# Patient Record
Sex: Female | Born: 2000
Health system: Southern US, Community
[De-identification: ages and names within clinical notes are randomized; demographics above are authoritative.]

## PROBLEM LIST (undated history)

## (undated) DIAGNOSIS — O039 Complete or unspecified spontaneous abortion without complication: Secondary | ICD-10-CM

## (undated) DIAGNOSIS — J45909 Unspecified asthma, uncomplicated: Secondary | ICD-10-CM

## (undated) HISTORY — PX: FRACTURE SURGERY: SHX138

## (undated) HISTORY — PX: OTHER SURGICAL HISTORY: SHX169

## (undated) HISTORY — PX: FINGER SURGERY: SHX640

## (undated) HISTORY — DX: Complete or unspecified spontaneous abortion without complication: O03.9

---
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
## 2019-11-05 ENCOUNTER — Inpatient Hospital Stay (HOSPITAL_COMMUNITY): Payer: Self-pay

## 2019-11-05 ENCOUNTER — Encounter (HOSPITAL_COMMUNITY): Payer: Self-pay | Admitting: Obstetrics and Gynecology

## 2019-11-05 ENCOUNTER — Inpatient Hospital Stay (HOSPITAL_COMMUNITY)
Admission: EM | Admit: 2019-11-05 | Discharge: 2019-11-05 | Disposition: A | Discharge status: Home / Self Care | Payer: Self-pay | Attending: Obstetrics and Gynecology | Admitting: Obstetrics and Gynecology

## 2019-11-05 ENCOUNTER — Other Ambulatory Visit: Payer: Self-pay

## 2019-11-05 DIAGNOSIS — O208 Other hemorrhage in early pregnancy: Secondary | ICD-10-CM | POA: Insufficient documentation

## 2019-11-05 DIAGNOSIS — J45909 Unspecified asthma, uncomplicated: Secondary | ICD-10-CM | POA: Insufficient documentation

## 2019-11-05 DIAGNOSIS — O3680X0 Pregnancy with inconclusive fetal viability, not applicable or unspecified: Secondary | ICD-10-CM

## 2019-11-05 DIAGNOSIS — Z6790 Unspecified blood type, Rh positive: Secondary | ICD-10-CM | POA: Insufficient documentation

## 2019-11-05 DIAGNOSIS — O4690 Antepartum hemorrhage, unspecified, unspecified trimester: Secondary | ICD-10-CM

## 2019-11-05 DIAGNOSIS — Z3A01 Less than 8 weeks gestation of pregnancy: Secondary | ICD-10-CM | POA: Insufficient documentation

## 2019-11-05 HISTORY — DX: Unspecified asthma, uncomplicated: J45.909

## 2019-11-05 LAB — CBC
HCT: 42.8 % (ref 36.0–46.0)
Hemoglobin: 13.5 g/dL (ref 12.0–15.0)
MCH: 27.8 pg (ref 26.0–34.0)
MCHC: 31.5 g/dL (ref 30.0–36.0)
MCV: 88.2 fL (ref 80.0–100.0)
Platelets: 320 10*3/uL (ref 150–400)
RBC: 4.85 MIL/uL (ref 3.87–5.11)
RDW: 13.2 % (ref 11.5–15.5)
WBC: 7.6 10*3/uL (ref 4.0–10.5)
nRBC: 0 % (ref 0.0–0.2)

## 2019-11-05 LAB — POCT PREGNANCY, URINE: Preg Test, Ur: POSITIVE — AB

## 2019-11-05 LAB — ABO/RH: ABO/RH(D): O POS

## 2019-11-05 LAB — URINALYSIS, ROUTINE W REFLEX MICROSCOPIC
Bilirubin Urine: NEGATIVE
Glucose, UA: NEGATIVE mg/dL
Hgb urine dipstick: NEGATIVE
Ketones, ur: NEGATIVE mg/dL
Leukocytes,Ua: NEGATIVE
Nitrite: NEGATIVE
Protein, ur: NEGATIVE mg/dL
Specific Gravity, Urine: 1.018 (ref 1.005–1.030)
pH: 7 (ref 5.0–8.0)

## 2019-11-05 LAB — WET PREP, GENITAL
Sperm: NONE SEEN
Trich, Wet Prep: NONE SEEN
Yeast Wet Prep HPF POC: NONE SEEN

## 2019-11-05 LAB — HCG, QUANTITATIVE, PREGNANCY: hCG, Beta Chain, Quant, S: 255 m[IU]/mL — ABNORMAL HIGH (ref <5)

## 2019-11-05 NOTE — Discharge Instructions (Signed)

## 2019-11-05 NOTE — ED Triage Notes (Signed)
Emergency Medicine Provider OB Triage Evaluation Note  [name] [name] is a 19 y.o. female, No obstetric history on file., at Unknown gestation who presents to the emergency department with complaints of vaginal bleeding.  She has had 3+ pregnancy test.  G1, P0.  First day of LMP was August 19.  She has an appointment at [facility], however has not yet been seen or had any ultrasounds this pregnancy.  She states that yesterday she started having vaginal spotting that is now progressed to vaginal bleeding with bilateral pelvic cramping.  She denies any trauma.  Review of  Systems  Positive: Vaginal bleeding Negative: No fevers  Physical Exam  BP 119/79   Pulse 92   Temp 98.5 F (36.9 C) (Oral)   Resp 16   Ht 5\' 6"  (1.676 m)   Wt 66.2 kg   SpO2 98%   BMI 23.57 kg/m  General: Awake, no distress  HEENT: Atraumatic  Resp: Normal effort  Cardiac: Normal rate Abd: Nondistended, nontender  MSK: Moves all extremities without difficulty Neuro: Speech clear  Medical Decision Making  Pt evaluated for pregnancy concern and is stable for transfer to [facility]. Pt is in agreement with plan for transfer.  1:50 PM Discussed with [facility] APP, , who accepts patient in transfer.  Clinical Impression   1. Vaginal bleeding in pregnancy        Shawna Orleans, Cristina Gong 11/05/19 1351

## 2019-11-05 NOTE — [facility] Note (Signed)
Found out preg last wk, +HPT x3. Started having some spotting yesterday, through out the day it got heavier, is more like period bleeding now. Some cramping.

## 2019-11-05 NOTE — MAU Provider Note (Signed)
History     CSN: [id number]  Arrival date and time: 11/05/19 1305   First Provider Initiated Contact with Patient 11/05/19 1722      Chief Complaint  Patient presents with  . Vaginal Bleeding  . Abdominal Pain   19 y.o. G1 @[redacted]w[redacted]d  by LMP presenting with VB. Reports spotting started yesterday. Today the bleeding became heavier and has been using a tampon every hour. Having some mild uterine cramping. Rates pain 5/10.    OB History    Gravida  1   Para      Term      Preterm      AB      Living        SAB      TAB      Ectopic      Multiple      Live Births              Past Medical History:  Diagnosis Date  . Asthma     Past Surgical History:  Procedure Laterality Date  . FINGER SURGERY    . FRACTURE SURGERY Left    4th digit  . tubes in ears      History reviewed. No pertinent family history.  Social History   Tobacco Use  . Smoking status: Never Smoker  . Smokeless tobacco: Never Used  Vaping Use  . Vaping Use: Former  Substance Use Topics  . Alcohol use: No  . Drug use: No    Allergies: No Known Allergies  No medications prior to admission.    Review of Systems  Gastrointestinal: Positive for abdominal pain.  Genitourinary: Positive for vaginal bleeding.   Physical Exam   Blood pressure (!) 123/56, pulse 79, temperature 98.3 F (36.8 C), temperature source Oral, resp. rate 18, height 5\' 6"  (1.676 m), weight 71.6 kg, last menstrual period 09/23/2019, SpO2 98 %.  Physical Exam Vitals and nursing note reviewed. Exam conducted with a chaperone present.  Constitutional:      General: She is not in acute distress.    Appearance: She is well-developed.  HENT:     Head: Normocephalic and atraumatic.  Cardiovascular:     Rate and Rhythm: Normal rate.  Pulmonary:     Effort: Pulmonary effort is normal. No respiratory distress.  Abdominal:     General: There is no distension.     Palpations: Abdomen is soft. There is no mass.      Tenderness: There is abdominal tenderness. There is no guarding or rebound.  Genitourinary:    Comments: External: no lesions or erythema Vagina: rugated, pink, moist, scant bloody discharge, cleared with 1 fox swab Uterus: non enlarged, anteverted, non tender, no CMT Adnexae: no masses, no tenderness left, no tenderness right Cervix closed  Musculoskeletal:        General: Normal range of motion.  Skin:    General: Skin is warm and dry.  Neurological:     General: No focal deficit present.     Mental Status: She is alert and oriented to person, place, and time.  Psychiatric:        Mood and Affect: Mood normal.        Behavior: Behavior normal.    Results for orders placed or performed during the hospital encounter of 11/05/19 (from the past 24 hour(s))  Pregnancy, urine POC     Status: Abnormal   Collection Time: 11/05/19  3:17 PM  Result Value Ref Range   Preg Test, Ur  POSITIVE (A) NEGATIVE  Urinalysis, Routine w reflex microscopic Urine, Clean Catch     Status: Abnormal   Collection Time: 11/05/19  3:30 PM  Result Value Ref Range   Color, Urine AMBER (A) YELLOW   APPearance CLOUDY (A) CLEAR   Specific Gravity, Urine 1.018 1.005 - 1.030   pH 7.0 5.0 - 8.0   Glucose, UA NEGATIVE NEGATIVE mg/dL   Hgb urine dipstick NEGATIVE NEGATIVE   Bilirubin Urine NEGATIVE NEGATIVE   Ketones, ur NEGATIVE NEGATIVE mg/dL   Protein, ur NEGATIVE NEGATIVE mg/dL   Nitrite NEGATIVE NEGATIVE   Leukocytes,Ua NEGATIVE NEGATIVE  CBC     Status: None   Collection Time: 11/05/19  4:26 PM  Result Value Ref Range   WBC 7.6 4.0 - 10.5 K/uL   RBC 4.85 3.87 - 5.11 MIL/uL   Hemoglobin 13.5 12.0 - 15.0 g/dL   HCT 51.7 36 - 46 %   MCV 88.2 80.0 - 100.0 fL   MCH 27.8 26.0 - 34.0 pg   MCHC 31.5 30.0 - 36.0 g/dL   RDW 61.6 07.3 - 71.0 %   Platelets 320 150 - 400 K/uL   nRBC 0.0 0.0 - 0.2 %  hCG, quantitative, pregnancy     Status: Abnormal   Collection Time: 11/05/19  4:26 PM  Result Value Ref  Range   hCG, Beta Chain, Quant, S 255 (H) <5 mIU/mL  ABO/Rh     Status: None   Collection Time: 11/05/19  4:26 PM  Result Value Ref Range   ABO/RH(D) O POS    No rh immune globuloin      NOT A RH IMMUNE GLOBULIN CANDIDATE, PT RH POSITIVE Performed at [facility], [address] [place], [place] [address]   Wet prep, genital     Status: Abnormal   Collection Time: 11/05/19  5:33 PM   Specimen: Cervix  Result Value Ref Range   Yeast Wet Prep HPF POC NONE SEEN NONE SEEN   Trich, Wet Prep NONE SEEN NONE SEEN   Clue Cells Wet Prep HPF POC PRESENT (A) NONE SEEN   WBC, Wet Prep HPF POC MODERATE (A) NONE SEEN   Sperm NONE SEEN    US OB LESS THAN 14 WEEKS WITH OB TRANSVAGINAL  Result Date: 11/05/2019 CLINICAL DATA:  Vaginal bleeding in first trimester of pregnancy, LMP 09/23/2019, quantitative beta HCG 255 EXAM: OBSTETRIC <14 WK [place] AND TRANSVAGINAL OB US TECHNIQUE: Both transabdominal and transvaginal ultrasound examinations were performed for complete evaluation of the gestation as well as the maternal uterus, adnexal regions, and pelvic cul-de-sac. Transvaginal technique was performed to assess early pregnancy. COMPARISON:  None FINDINGS: Intrauterine gestational sac: Absent Yolk sac:  N/A Embryo:  N/A Cardiac Activity: N/A Heart Rate: N/A  bpm MSD:   mm    w     d CRL:    mm    w    d                  [place] EDC: Subchorionic hemorrhage:  Insert LEFT Maternal uterus/adnexae: Uterus retroverted, normal in appearance. Small amount of endocervical canal fluid is seen. No focal uterine mass, endometrial fluid, or gestational sac identified. Endometrial complex measures 5 mm thick. RIGHT ovary normal size and morphology, 3.5 x 2.1 x 3.5 cm. LEFT ovary normal size and morphology, 1.4 x 3.4 x 1.6 cm. No free pelvic fluid or adnexal masses. IMPRESSION: No intrauterine gestation identified. Findings are consistent with pregnancy of unknown location. Differential  diagnosis includes early intrauterine  pregnancy too early to visualize, spontaneous abortion, and ectopic pregnancy. Serial quantitative beta HCG and or follow-up ultrasound recommended to definitively exclude ectopic pregnancy. Electronically Signed   By: Ulyses Southward M.D.   On: 11/05/2019 18:33   MAU Course  Procedures  MDM Labs ordered and reviewed. No IUGS, YS or FP seen on [place], findings could indicate early pregnancy, ectopic pregnancy, or failed pregnancy, discussed with pt. Will follow quant in 48 hrs. Stable for discharge home.   Assessment and Plan   1. Vaginal bleeding in pregnancy   2. Pregnancy, location unknown   3. Blood type, Rh positive    Discharge home Follow up at MAU on 11/07/19 for qhcg Ectopic/SAB precautions Tylenol prn Heating pad prn Pelvic rest  Allergies as of 11/05/2019   No Known Allergies     Medication List    STOP taking these medications   fexofenadine-pseudoephedrine 180-240 MG 24 hr tablet Commonly known as: ALLEGRA-D 24   Sprintec 28 0.25-35 MG-MCG tablet Generic drug: norgestimate-ethinyl estradiol      Donette Larry, CNM 11/05/2019, 7:21 PM

## 2019-11-05 NOTE — ED Triage Notes (Signed)
Pt had + pregnancy test 1 week ago, has had progressively worse bleeding since yesterday. Pt endorses 4/10 abdominal cramping x 1 day.

## 2019-11-07 ENCOUNTER — Other Ambulatory Visit: Payer: Self-pay

## 2019-11-07 ENCOUNTER — Inpatient Hospital Stay (HOSPITAL_COMMUNITY)
Admission: AD | Admit: 2019-11-07 | Discharge: 2019-11-07 | Disposition: A | Discharge status: Home / Self Care | Payer: Self-pay | Attending: Obstetrics & Gynecology | Admitting: Obstetrics & Gynecology

## 2019-11-07 DIAGNOSIS — O039 Complete or unspecified spontaneous abortion without complication: Secondary | ICD-10-CM | POA: Insufficient documentation

## 2019-11-07 LAB — HCG, QUANTITATIVE, PREGNANCY: hCG, Beta Chain, Quant, S: 100 m[IU]/mL — ABNORMAL HIGH (ref <5)

## 2019-11-07 NOTE — MAU Provider Note (Signed)
S [name] [name] is a 19 y.o. G1P0 at [redacted]w[redacted]d female who presents to MAU today for repeat bHCG. She has continued to have light-moderate bleeding and mild cramping.   O BP 104/72 (BP Location: Right Arm)   Pulse 65   Temp 98.4 F (36.9 C) (Oral)   Resp 17   LMP 09/23/2019   SpO2 98% Comment: ra  Results for orders placed or performed during the hospital encounter of 11/07/19 (from the past 24 hour(s))  hCG, quantitative, pregnancy     Status: Abnormal   Collection Time: 11/07/19  4:36 PM  Result Value Ref Range   hCG, Beta Chain, Quant, S 100 (H) <5 mIU/mL   A Spontaneous miscarriage Medical screening exam complete  P Discharge from MAU in stable condition with bleeding precautions Patient to establish care at [facility] for SAB follow up and gyn care Discussed reasons for early miscarriage and gave reassurance to patient about future fertility  Edd Arbour, CNM, MSN, [facility] 11/07/19 6:49 PM

## 2019-11-07 NOTE — MAU Note (Signed)
[name] [name] is a 19 y.o. at [redacted]w[redacted]d here in MAU reporting:  For follow up blood work. Denies pain +vaginal bleeding still but reports it is less than what is was before.  Vitals:   11/07/19 1641  BP: 104/72  Pulse: 65  Resp: 17  Temp: 98.4 F (36.9 C)  SpO2: 98%     Patient in family room.

## 2019-11-07 NOTE — Discharge Instructions (Signed)
Miscarriage A miscarriage is the loss of an unborn baby (fetus) before the 20th week of pregnancy. Follow these instructions at home: Medicines   Take over-the-counter and prescription medicines only as told by your doctor.  If you were prescribed antibiotic medicine, take it as told by your doctor. Do not stop taking the antibiotic even if you start to feel better.  Do not take NSAIDs unless your doctor says that this is safe for you. NSAIDs include aspirin and ibuprofen. These medicines can cause bleeding. Activity  Rest as directed. Ask your doctor what activities are safe for you.  Have someone help you at home during this time. General instructions  Write down how many pads you use each day and how soaked they are.  Watch the amount of tissue or clumps of blood (blood clots) that you pass from your vagina. Save any large amounts of tissue for your doctor.  Do not use tampons, douche, or have sex until your doctor approves.  To help you and your partner with the process of grieving, talk with your doctor or seek counseling.  When you are ready, meet with your doctor to talk about steps you should take for your health. Also, talk with your doctor about steps to take to have a healthy pregnancy in the future.  Keep all follow-up visits as told by your doctor. This is important. Contact a doctor if:  You have a fever or chills.  You have vaginal discharge that smells bad.  You have more bleeding. Get help right away if:  You have very bad cramps or pain in your back or belly.  You pass clumps of blood that are walnut-sized or larger from your vagina.  You pass tissue that is walnut-sized or larger from your vagina.  You soak more than 1 regular pad in an hour.  You get light-headed or weak.  You faint (pass out).  You have feelings of sadness that do not go away, or you have thoughts of hurting yourself. Summary  A miscarriage is the loss of an unborn baby before  the 20th week of pregnancy.  Follow your doctor's instructions for home care. Keep all follow-up appointments.  To help you and your partner with the process of grieving, talk with your doctor or seek counseling. This information is not intended to replace advice given to you by your health care provider. Make sure you discuss any questions you have with your health care provider. Document Revised: 05/15/2018 Document Reviewed: 02/27/2016 Elsevier Patient Education  2020 Elsevier Inc.  

## 2019-11-08 LAB — GC/CHLAMYDIA PROBE AMP (~~LOC~~) NOT AT ARMC
Chlamydia: NEGATIVE
Comment: NEGATIVE
Comment: NORMAL
Neisseria Gonorrhea: NEGATIVE

## 2019-11-15 ENCOUNTER — Other Ambulatory Visit: Payer: Self-pay

## 2019-11-15 DIAGNOSIS — O039 Complete or unspecified spontaneous abortion without complication: Secondary | ICD-10-CM

## 2019-11-17 ENCOUNTER — Telehealth: Payer: Self-pay | Admitting: *Deleted

## 2019-11-17 NOTE — Telephone Encounter (Signed)
-----   Message from Kimberly R Booker, CNM sent at 11/17/2019 10:47 AM EDT ----- Please call pt and have her come in for a repeat BHCG to make sure levels are continuing to drop. Thanks 

## 2019-11-17 NOTE — Telephone Encounter (Signed)
Left message @ 12:26 pm. JSY

## 2019-11-18 NOTE — Telephone Encounter (Signed)
Left message @ 12:37 pm. JSY

## 2019-11-18 NOTE — Telephone Encounter (Signed)
-----   Message from Cheral Marker, [place] sent at 11/17/2019 10:47 AM EDT ----- Please call pt and have her come in for a repeat BHCG to make sure levels are continuing to drop. Thanks

## 2019-11-22 ENCOUNTER — Telehealth: Payer: Self-pay | Admitting: *Deleted

## 2019-11-22 ENCOUNTER — Ambulatory Visit: Payer: Self-pay | Admitting: Advanced Practice Midwife

## 2019-11-22 NOTE — Telephone Encounter (Signed)
Left message @ 4:33 pm. JSY 

## 2019-11-22 NOTE — Telephone Encounter (Signed)
Left message @ 4:33 pm. JSY

## 2019-11-29 NOTE — Telephone Encounter (Signed)
Multiple attempts to reach pt and pt was unreachable. Encounter closed. JSY

## 2020-10-23 ENCOUNTER — Encounter (HOSPITAL_COMMUNITY): Payer: Self-pay | Admitting: Obstetrics and Gynecology

## 2020-10-23 ENCOUNTER — Other Ambulatory Visit: Payer: Self-pay

## 2020-10-23 ENCOUNTER — Ambulatory Visit: Payer: Self-pay

## 2020-10-23 ENCOUNTER — Inpatient Hospital Stay (HOSPITAL_COMMUNITY)
Admission: AD | Admit: 2020-10-23 | Discharge: 2020-10-23 | Disposition: A | Discharge status: Home / Self Care | Payer: BC Managed Care – PPO | Attending: Obstetrics and Gynecology | Admitting: Obstetrics and Gynecology

## 2020-10-23 ENCOUNTER — Inpatient Hospital Stay (HOSPITAL_COMMUNITY): Payer: BC Managed Care – PPO

## 2020-10-23 DIAGNOSIS — Z6790 Unspecified blood type, Rh positive: Secondary | ICD-10-CM

## 2020-10-23 DIAGNOSIS — Z3A10 10 weeks gestation of pregnancy: Secondary | ICD-10-CM | POA: Diagnosis not present

## 2020-10-23 DIAGNOSIS — O4690 Antepartum hemorrhage, unspecified, unspecified trimester: Secondary | ICD-10-CM

## 2020-10-23 DIAGNOSIS — O4691 Antepartum hemorrhage, unspecified, first trimester: Secondary | ICD-10-CM | POA: Diagnosis not present

## 2020-10-23 DIAGNOSIS — Z3A00 Weeks of gestation of pregnancy not specified: Secondary | ICD-10-CM | POA: Diagnosis not present

## 2020-10-23 DIAGNOSIS — O3680X0 Pregnancy with inconclusive fetal viability, not applicable or unspecified: Secondary | ICD-10-CM | POA: Diagnosis not present

## 2020-10-23 DIAGNOSIS — O209 Hemorrhage in early pregnancy, unspecified: Secondary | ICD-10-CM | POA: Insufficient documentation

## 2020-10-23 DIAGNOSIS — Z87891 Personal history of nicotine dependence: Secondary | ICD-10-CM | POA: Diagnosis not present

## 2020-10-23 LAB — COMPREHENSIVE METABOLIC PANEL
ALT: 36 U/L (ref 0–44)
AST: 30 U/L (ref 15–41)
Albumin: 3.5 g/dL (ref 3.5–5.0)
Alkaline Phosphatase: 61 U/L (ref 38–126)
Anion gap: 7 (ref 5–15)
BUN: 10 mg/dL (ref 6–20)
CO2: 25 mmol/L (ref 22–32)
Calcium: 9.1 mg/dL (ref 8.9–10.3)
Chloride: 105 mmol/L (ref 98–111)
Creatinine, Ser: 0.72 mg/dL (ref 0.44–1.00)
GFR, Estimated: >60 mL/min (ref >60)
Glucose, Bld: 111 mg/dL — ABNORMAL HIGH (ref 70–99)
Potassium: 3.7 mmol/L (ref 3.5–5.1)
Sodium: 137 mmol/L (ref 135–145)
Total Bilirubin: 1 mg/dL (ref 0.3–1.2)
Total Protein: 6.5 g/dL (ref 6.5–8.1)

## 2020-10-23 LAB — CBC WITH DIFFERENTIAL/PLATELET
Abs Immature Granulocytes: 0.01 10*3/uL (ref 0.00–0.07)
Basophils Absolute: 0.1 10*3/uL (ref 0.0–0.1)
Basophils Relative: 1 %
Eosinophils Absolute: 0.2 10*3/uL (ref 0.0–0.5)
Eosinophils Relative: 2 %
HCT: 38.1 % (ref 36.0–46.0)
Hemoglobin: 12.4 g/dL (ref 12.0–15.0)
Immature Granulocytes: 0 %
Lymphocytes Relative: 23 %
Lymphs Abs: 2.1 10*3/uL (ref 0.7–4.0)
MCH: 28 pg (ref 26.0–34.0)
MCHC: 32.5 g/dL (ref 30.0–36.0)
MCV: 86 fL (ref 80.0–100.0)
Monocytes Absolute: 0.9 10*3/uL (ref 0.1–1.0)
Monocytes Relative: 10 %
Neutro Abs: 6 10*3/uL (ref 1.7–7.7)
Neutrophils Relative %: 64 %
Platelets: 318 10*3/uL (ref 150–400)
RBC: 4.43 MIL/uL (ref 3.87–5.11)
RDW: 12.9 % (ref 11.5–15.5)
WBC: 9.2 10*3/uL (ref 4.0–10.5)
nRBC: 0 % (ref 0.0–0.2)

## 2020-10-23 LAB — WET PREP, GENITAL
Sperm: NONE SEEN
Trich, Wet Prep: NONE SEEN
Yeast Wet Prep HPF POC: NONE SEEN

## 2020-10-23 LAB — HCG, QUANTITATIVE, PREGNANCY: hCG, Beta Chain, Quant, S: 749 m[IU]/mL — ABNORMAL HIGH (ref <5)

## 2020-10-23 LAB — POCT PREGNANCY, URINE: Preg Test, Ur: POSITIVE — AB

## 2020-10-23 NOTE — MAU Note (Signed)
Unable to void. Pt to lobby

## 2020-10-23 NOTE — MAU Note (Signed)
+  HPT about 1.5 wks ago.  Started bleeding around 1130, started as light pink, spotting. About an hour ago, saw that it was a lot darker and there was some small clots in it. Some cramping.

## 2020-10-23 NOTE — MAU Provider Note (Signed)
History     CSN: [id number]  Arrival date and time: 10/23/20 1610   Event Date/Time   First Provider Initiated Contact with Patient 10/23/20 1739      No chief complaint on file.  Ms. [name] [name] is a 20 y.o. G2P0 at [redacted]w[redacted]d who presents to MAU for vaginal bleeding which began about 11am-12pm this morning. Patient states initially it was just pink spotting, but then it turned in to darker red bleeding with small clots. Patient shows provider images of her bleeding, which shows a small amount of dark red blood without clotting. Patient stats she had cramping earlier today, but it is not present at this time.  Pt denies vaginal discharge/odor/itching. Pt denies N/V, abdominal pain, constipation, diarrhea, or urinary problems. Pt denies fever, chills, fatigue, sweating or changes in appetite. Pt denies SOB or chest pain. Pt denies dizziness, HA, light-headedness, weakness.   OB History     Gravida  2   Para      Term      Preterm      AB      Living         SAB      IAB      Ectopic      Multiple      Live Births              Past Medical History:  Diagnosis Date   Asthma     Past Surgical History:  Procedure Laterality Date   FINGER SURGERY     FRACTURE SURGERY Left    4th digit   tubes in ears      No family history on file.  Social History   Tobacco Use   Smoking status: Never   Smokeless tobacco: Never  Vaping Use   Vaping Use: Former  Substance Use Topics   Alcohol use: No   Drug use: No    Allergies: No Known Allergies  No medications prior to admission.    Review of Systems  Constitutional:  Negative for chills, diaphoresis, fatigue and fever.  Eyes:  Negative for visual disturbance.  Respiratory:  Negative for shortness of breath.   Cardiovascular:  Negative for chest pain.  Gastrointestinal:  Negative for abdominal pain, constipation, diarrhea, nausea and vomiting.  Genitourinary:  Positive for vaginal bleeding.  Negative for dysuria, flank pain, frequency, pelvic pain, urgency and vaginal discharge.  Neurological:  Negative for dizziness, weakness, light-headedness and headaches.   Physical Exam   Blood pressure 118/73, pulse 74, temperature 98.7 F (37.1 C), temperature source Oral, resp. rate 18, height 5\' 6"  (1.676 m), weight 92 kg, last menstrual period 08/11/2020, SpO2 96 %, unknown if currently breastfeeding.  Patient Vitals for the past 24 hrs:  BP Temp Temp src Pulse Resp SpO2 Height Weight  10/23/20 1731 -- -- -- -- -- 96 % -- --  10/23/20 1730 118/73 -- -- 74 -- -- -- --  10/23/20 1624 115/68 98.7 F (37.1 C) Oral 75 18 99 % 5\' 6"  (1.676 m) 92 kg   Physical Exam Vitals and nursing note reviewed.  Constitutional:      General: She is not in acute distress.    Appearance: Normal appearance. She is not ill-appearing, toxic-appearing or diaphoretic.  HENT:     Head: Normocephalic and atraumatic.  Pulmonary:     Effort: Pulmonary effort is normal.  Neurological:     Mental Status: She is alert and oriented to person, place, and time.  Psychiatric:  Mood and Affect: Mood normal.        Behavior: Behavior normal.        Thought Content: Thought content normal.        Judgment: Judgment normal.   Results for orders placed or performed during the hospital encounter of 10/23/20 (from the past 24 hour(s))  Pregnancy, urine POC     Status: Abnormal   Collection Time: 10/23/20  5:21 PM  Result Value Ref Range   Preg Test, Ur POSITIVE (A) NEGATIVE  Wet prep, genital     Status: Abnormal   Collection Time: 10/23/20  5:56 PM  Result Value Ref Range   Yeast Wet Prep HPF POC NONE SEEN NONE SEEN   Trich, Wet Prep NONE SEEN NONE SEEN   Clue Cells Wet Prep HPF POC PRESENT (A) NONE SEEN   WBC, Wet Prep HPF POC MANY (A) NONE SEEN   Sperm NONE SEEN   CBC with Differential/Platelet     Status: None   Collection Time: 10/23/20  6:00 PM  Result Value Ref Range   WBC 9.2 4.0 - 10.5 K/uL    RBC 4.43 3.87 - 5.11 MIL/uL   Hemoglobin 12.4 12.0 - 15.0 g/dL   HCT 28.7 68.1 - 15.7 %   MCV 86.0 80.0 - 100.0 fL   MCH 28.0 26.0 - 34.0 pg   MCHC 32.5 30.0 - 36.0 g/dL   RDW 26.2 03.5 - 59.7 %   Platelets 318 150 - 400 K/uL   nRBC 0.0 0.0 - 0.2 %   Neutrophils Relative % 64 %   Neutro Abs 6.0 1.7 - 7.7 K/uL   Lymphocytes Relative 23 %   Lymphs Abs 2.1 0.7 - 4.0 K/uL   Monocytes Relative 10 %   Monocytes Absolute 0.9 0.1 - 1.0 K/uL   Eosinophils Relative 2 %   Eosinophils Absolute 0.2 0.0 - 0.5 K/uL   Basophils Relative 1 %   Basophils Absolute 0.1 0.0 - 0.1 K/uL   Immature Granulocytes 0 %   Abs Immature Granulocytes 0.01 0.00 - 0.07 K/uL  Comprehensive metabolic panel     Status: Abnormal   Collection Time: 10/23/20  6:00 PM  Result Value Ref Range   Sodium 137 135 - 145 mmol/L   Potassium 3.7 3.5 - 5.1 mmol/L   Chloride 105 98 - 111 mmol/L   CO2 25 22 - 32 mmol/L   Glucose, Bld 111 (H) 70 - 99 mg/dL   BUN 10 6 - 20 mg/dL   Creatinine, Ser 4.16 0.44 - 1.00 mg/dL   Calcium 9.1 8.9 - 38.4 mg/dL   Total Protein 6.5 6.5 - 8.1 g/dL   Albumin 3.5 3.5 - 5.0 g/dL   AST 30 15 - 41 U/L   ALT 36 0 - 44 U/L   Alkaline Phosphatase 61 38 - 126 U/L   Total Bilirubin 1.0 0.3 - 1.2 mg/dL   GFR, Estimated >53 >64 mL/min   Anion gap 7 5 - 15  hCG, quantitative, pregnancy     Status: Abnormal   Collection Time: 10/23/20  6:00 PM  Result Value Ref Range   hCG, Beta Chain, Quant, S 749 (H) <5 mIU/mL   US OB LESS THAN 14 WEEKS WITH OB TRANSVAGINAL  Result Date: 10/23/2020 CLINICAL DATA:  20 year old pregnant female with vaginal bleeding. EXAM: OBSTETRIC <14 WK [place] AND TRANSVAGINAL OB US TECHNIQUE: Both transabdominal and transvaginal ultrasound examinations were performed for complete evaluation of the gestation as well as the maternal uterus, adnexal  regions, and pelvic cul-de-sac. Transvaginal technique was performed to assess early pregnancy. COMPARISON:  OB ultrasound 11/05/2019.  FINDINGS: Intrauterine gestational sac: None. Yolk sac:  None. Embryo:  None. Cardiac Activity: None. Heart Rate: N/A Subchorionic hemorrhage:  None visualized. Maternal uterus/adnexae: Mobile echogenic material in the endometrial canal likely represents blood products. Bilateral ovaries are normal in appearance. IMPRESSION: 1. No viable IUP identified. Findings on today's examination likely reflect a spontaneous abortion in progress. Electronically Signed   By: Trudie Reed M.D.   On: 10/23/2020 19:36    MAU Course  Procedures  MDM -r/o ectopic -UA: pending at time of discharge -CBC: WNL -CMP: WNL -[place]: PUL -hCG: 749 -ABO: O Positive -WetPrep: +ClueCells (isolated finding not requiring treatment) -GC/CT collected Discussed with client the diagnosis of pregnancy of unknown anatomic location.  Three possibilities of outcome are: a healthy pregnancy that is too early to see a yolk sac to confirm the pregnancy is in the uterus, a pregnancy that is not healthy and has not developed and will not develop, and an ectopic pregnancy that is in the abdomen that cannot be identified at this time.  And ectopic pregnancy can be a life threatening situation as a pregnancy needs to be in the uterus which is a muscle and can stretch to accommodate the growth of a pregnancy.  Other structures in the pelvis and abdomen as not muscular and do not stretch with the growth of a pregnancy.  Worst case scenario is that a structure ruptures with a growing pregnancy not in the uterus and and internal hemorrhage can be a life threatening situation.  We need to follow the progression of this pregnancy carefully.  We need to check another serum pregnancy hormone level to determine if the levels are rising appropriately  and to determine the next steps that are needed for you. Patient's questions were answered. -pt states is not currently established with an OB in [place], but does have appt with Nestor Ramp on Friday of  this week -called and spoke with Dr. Timothy Lasso who states that [facility] will not see this patient for f/u hCG as she is not an established patient -pt discharged to home in stable condition  Orders Placed This Encounter  Procedures   Wet prep, genital    Standing Status:   Standing    Number of Occurrences:   1   US OB LESS THAN 14 WEEKS WITH OB TRANSVAGINAL    Standing Status:   Standing    Number of Occurrences:   1    Order Specific Question:   Symptom/Reason for Exam    Answer:   Vaginal bleeding in pregnancy [[id number]]   Urinalysis, Routine w reflex microscopic Urine, Clean Catch    Standing Status:   Standing    Number of Occurrences:   1   CBC with Differential/Platelet    Standing Status:   Standing    Number of Occurrences:   1   Comprehensive metabolic panel    Standing Status:   Standing    Number of Occurrences:   1   hCG, quantitative, pregnancy    Standing Status:   Standing    Number of Occurrences:   1   Pregnancy, urine POC    Standing Status:   Standing    Number of Occurrences:   1   Discharge patient    Order Specific Question:   Discharge disposition    Answer:   01-Home or Self Care [1]  Order Specific Question:   Discharge patient date    Answer:   10/23/2020   No orders of the defined types were placed in this encounter.  Assessment and Plan   1. Pregnancy of unknown anatomic location   2. Vaginal bleeding in pregnancy   3. Blood type, Rh positive     Allergies as of 10/23/2020   No Known Allergies      Medication List    You have not been prescribed any medications.    -will call with culture results, if positive -safe meds in pregnancy list given -discussed ectopic vs. First trimester vs. miscarriage -strict ectopic precautions given -return MAU precautions -f/u on 10/26/2020 at [facility] for repeat hCG, appt scheduled -pt discharged to home in stable condition  Joni Reining E Binh Doten 10/23/2020, 8:20 PM

## 2020-10-23 NOTE — Telephone Encounter (Signed)
Pt. Reports she is about [redacted] weeks pregnant and this morning saw pink discharge on tissue paper. No pain or cramping. Waiting for call back for appointment from OB/GYN. Instructed to call OB office and let them know. Verbalizes understanding.    Reason for Disposition  MILD vaginal bleeding (i.e., less than 1 pad / hour; less than patient's usual menstrual bleeding; not just spotting)  Answer Assessment - Initial Assessment Questions 1. ONSET: "When did this bleeding start?"       Today 2. DESCRIPTION: "Describe the bleeding that you are having." "How much bleeding is there?"    - SPOTTING: spotting, or pinkish / brownish mucous discharge; does not fill panty liner or pad    - MILD:  less than 1 pad / hour; less than patient's usual menstrual bleeding   - MODERATE: 1-2 pads / hour; 1 menstrual cup every 6 hours; small-medium blood clots (e.g., pea, grape, small coin)   - SEVERE: soaking 2 or more pads/hour for 2 or more hours; 1 menstrual cup every 2 hours; bleeding not contained by pads or continuous red blood from vagina; large blood clots (e.g., golf ball, large coin)      Spotting 3. ABDOMINAL PAIN SEVERITY: If present, ask: "How bad is it?"  (e.g., Scale 1-10; mild, moderate, or severe)   - MILD (1-3): doesn't interfere with normal activities, abdomen soft and not tender to touch    - MODERATE (4-7): interferes with normal activities or awakens from sleep, abdomen tender to touch    - SEVERE (8-10): excruciating pain, doubled over, unable to do any normal activities     No 4. PREGNANCY: "Do you know how many weeks or months pregnant you are?" "When was the first day of your last normal menstrual period?"     8 weeks 5. HEMODYNAMIC STATUS: "Are you weak or feeling lightheaded?" If Yes, ask: "Can you stand and walk normally?"      No 6. OTHER SYMPTOMS: "What other symptoms are you having with the bleeding?" (e.g., passed tissue, vaginal discharge, fever, menstrual-type cramps)      No  Protocols used: Pregnancy - Vaginal Bleeding Less Than [redacted] Weeks EGA-A-AH

## 2020-10-23 NOTE — Discharge Instructions (Signed)

## 2020-10-24 LAB — GC/CHLAMYDIA PROBE AMP (~~LOC~~) NOT AT ARMC
Chlamydia: NEGATIVE
Comment: NEGATIVE
Comment: NORMAL
Neisseria Gonorrhea: NEGATIVE

## 2020-10-26 ENCOUNTER — Ambulatory Visit: Payer: BC Managed Care – PPO

## 2021-01-25 ENCOUNTER — Ambulatory Visit (INDEPENDENT_AMBULATORY_CARE_PROVIDER_SITE_OTHER): Payer: BC Managed Care – PPO | Admitting: Family

## 2021-01-25 ENCOUNTER — Encounter: Payer: Self-pay | Admitting: Family

## 2021-01-25 ENCOUNTER — Other Ambulatory Visit (HOSPITAL_COMMUNITY)
Admission: RE | Admit: 2021-01-25 | Discharge: 2021-01-25 | Disposition: A | Discharge status: Home / Self Care | Payer: BC Managed Care – PPO | Source: Ambulatory Visit | Attending: Family | Admitting: Family

## 2021-01-25 ENCOUNTER — Other Ambulatory Visit: Payer: Self-pay | Admitting: Family

## 2021-01-25 VITALS — BP 106/74 | HR 91 | Temp 97.5°F | Ht 66.0 in | Wt 195.2 lb

## 2021-01-25 DIAGNOSIS — R350 Frequency of micturition: Secondary | ICD-10-CM | POA: Diagnosis not present

## 2021-01-25 DIAGNOSIS — J069 Acute upper respiratory infection, unspecified: Secondary | ICD-10-CM | POA: Diagnosis not present

## 2021-01-25 DIAGNOSIS — Z113 Encounter for screening for infections with a predominantly sexual mode of transmission: Secondary | ICD-10-CM | POA: Diagnosis not present

## 2021-01-25 DIAGNOSIS — B350 Tinea barbae and tinea capitis: Secondary | ICD-10-CM | POA: Diagnosis not present

## 2021-01-25 LAB — URINALYSIS, COMPLETE
Bilirubin, UA: NEGATIVE
Glucose, UA: NEGATIVE
Ketones, UA: NEGATIVE
Nitrite, UA: POSITIVE — AB
Specific Gravity, UA: 1.02 (ref 1.005–1.030)
Urobilinogen, Ur: 0.2 mg/dL (ref 0.2–1.0)
pH, UA: 6.5 (ref 5.0–7.5)

## 2021-01-25 LAB — MICROSCOPIC EXAMINATION
RBC, Urine: NONE SEEN /hpf (ref 0–2)
WBC, UA: >30 /hpf — AB (ref 0–5)

## 2021-01-25 MED ORDER — CEPHALEXIN 500 MG PO CAPS: 500.0000 mg | ORAL_CAPSULE | Freq: Two times a day (BID) | ORAL | 0 refills | Status: DC

## 2021-01-25 MED ORDER — PREDNISONE 10 MG (21) PO TBPK: ORAL_TABLET | ORAL | 0 refills | Status: DC

## 2021-01-25 NOTE — Progress Notes (Signed)
Subjective:    Patient ID: [name] [name], female    DOB: [birth year], 20 y.o.   MRN: [id number]  Chief Complaint  Patient presents with   Exposure to STD    Has a new partner just wants checked.    Urinary Tract Infection   URI    Over a week    PT reports to the office today with UTI symptoms. She has had a new sex partner and wants to be checked for STI's while here.  Exposure to STD  The patient's pertinent negatives include no discharge, dysuria or pelvic pain. Associate symptoms include urinary frequency. Pertinent negatives include no sore throat.  URI  This is a new problem. The current episode started 1 to 4 weeks ago. The problem has been waxing and waning. Associated symptoms include congestion, coughing and rhinorrhea. Pertinent negatives include no dysuria, ear pain, headaches, nausea, sinus pain, sore throat or vomiting. She has tried decongestant for the symptoms. The treatment provided mild relief.  Urinary Frequency  This is a new problem. The current episode started in the past 7 days. The problem has been rapidly improving. The patient is experiencing no pain. Associated symptoms include frequency and hesitancy. Pertinent negatives include no discharge, flank pain, hematuria, nausea, urgency or vomiting. She has tried increased fluids for the symptoms. The treatment provided mild relief.     Review of Systems  HENT:  Positive for congestion and rhinorrhea. Negative for ear pain, sinus pain and sore throat.   Respiratory:  Positive for cough.   Gastrointestinal:  Negative for nausea and vomiting.  Genitourinary:  Positive for frequency and hesitancy. Negative for dysuria, flank pain, hematuria, pelvic pain and urgency.  Neurological:  Negative for headaches.  All other systems reviewed and are negative.     Objective:   Physical Exam Vitals reviewed.  Constitutional:      General: She is not in acute distress.    Appearance: She is well-developed.  HENT:      Head: Normocephalic and atraumatic.     Right Ear: Tympanic membrane normal.     Left Ear: Tympanic membrane normal.  Eyes:     Pupils: Pupils are equal, round, and reactive to light.  Neck:     Thyroid: No thyromegaly.  Cardiovascular:     Rate and Rhythm: Normal rate and regular rhythm.     Heart sounds: Normal heart sounds. No murmur heard. Pulmonary:     Effort: Pulmonary effort is normal. No respiratory distress.     Breath sounds: Normal breath sounds. No wheezing.     Comments: Intermittent cough Abdominal:     General: Bowel sounds are normal. There is no distension.     Palpations: Abdomen is soft.     Tenderness: There is no abdominal tenderness.  Musculoskeletal:        General: No tenderness. Normal range of motion.     Cervical back: Normal range of motion and neck supple.  Skin:    General: Skin is warm and dry.  Neurological:     Mental Status: She is alert and oriented to person, place, and time.     Cranial Nerves: No cranial nerve deficit.     Deep Tendon Reflexes: Reflexes are normal and symmetric.  Psychiatric:        Behavior: Behavior normal.        Thought Content: Thought content normal.        Judgment: Judgment normal.    BP 106/74  Pulse 91    Temp (!) 97.5 F (36.4 C) (Temporal)    Ht 5\' 6"  (1.676 m)    Wt 195 lb 3.2 oz (88.5 kg)    LMP 01/04/2021    Breastfeeding Unknown    BMI 31.51 kg/m      Assessment & Plan:  [name] [name] comes in today with chief complaint of Exposure to STD (Has a new partner just wants checked. ), Urinary Tract Infection, and URI (Over a week )   Diagnosis and orders addressed:  1. Viral URI with cough - Take meds as prescribed - Use a cool mist humidifier  -Use saline nose sprays frequently -Force fluids -For any cough or congestion  Use plain Mucinex- regular strength or max strength is fine -For fever or aces or pains- take tylenol or ibuprofen. -Throat lozenges if help -Smoking cessation discussed   Start prednisone  - predniSONE (STERAPRED UNI-PAK 21 TAB) 10 MG (21) TBPK tablet; Use as directed  Dispense: 21 tablet; Refill: 0  2. Urinary frequency - Urinalysis, Complete - Urine Culture  3. Routine screening for STI (sexually transmitted infection) Safe sex - Urine cytology ancillary only - HepB+HepC+HIV Panel - Herpes simplex virus(hsv) dna by pcr - RPR     [name] Dun, FNP

## 2021-01-25 NOTE — Progress Notes (Signed)
Pt aware of urine results and that keflex was sent to pharm

## 2021-01-25 NOTE — Addendum Note (Signed)
Addended by: Austin Miles F on: 01/25/2021 12:10 PM   Modules accepted: Orders

## 2021-01-25 NOTE — Patient Instructions (Signed)

## 2021-01-26 LAB — URINE CYTOLOGY ANCILLARY ONLY
Bacterial Vaginitis-Urine: NEGATIVE
Candida Urine: NEGATIVE
Chlamydia: NEGATIVE
Comment: NEGATIVE
Comment: NEGATIVE
Comment: NORMAL
Neisseria Gonorrhea: NEGATIVE
Trichomonas: NEGATIVE

## 2021-01-30 LAB — HEPB+HEPC+HIV PANEL
HIV Screen 4th Generation wRfx: NONREACTIVE
Hep B C IgM: NEGATIVE
Hep B Core Total Ab: NEGATIVE
Hep B E Ab: NEGATIVE
Hep B E Ag: NEGATIVE
Hep B Surface Ab, Qual: NONREACTIVE
Hep C Virus Ab: <0.1 s/co ratio (ref 0.0–0.9)
Hepatitis B Surface Ag: NEGATIVE

## 2021-01-30 LAB — URINE CULTURE

## 2021-01-30 LAB — HSV(HERPES SIMPLEX VRS) I + II AB-IGG
HSV 1 Glycoprotein G Ab, IgG: <0.91 index (ref 0.00–0.90)
HSV 2 IgG, Type Spec: <0.91 index (ref 0.00–0.90)

## 2021-01-30 LAB — RPR: RPR Ser Ql: NONREACTIVE

## 2021-03-13 ENCOUNTER — Encounter: Payer: Self-pay | Admitting: Family

## 2021-03-13 ENCOUNTER — Ambulatory Visit (INDEPENDENT_AMBULATORY_CARE_PROVIDER_SITE_OTHER): Payer: Medicaid Other | Admitting: Family

## 2021-03-13 VITALS — BP 121/71 | HR 103 | Temp 97.6°F | Ht 66.0 in | Wt 198.6 lb

## 2021-03-13 DIAGNOSIS — Z3A01 Less than 8 weeks gestation of pregnancy: Secondary | ICD-10-CM

## 2021-03-13 DIAGNOSIS — N926 Irregular menstruation, unspecified: Secondary | ICD-10-CM | POA: Diagnosis not present

## 2021-03-13 LAB — PREGNANCY, URINE: Preg Test, Ur: POSITIVE — AB

## 2021-03-13 NOTE — Patient Instructions (Signed)
First Trimester of Pregnancy °The first trimester of pregnancy starts on the first day of your last menstrual period until the end of week 12. This is months 1 through 3 of pregnancy. A week after a sperm fertilizes an egg, the egg will implant into the wall of the uterus and begin to develop into a baby. By the end of 12 weeks, all the baby's organs will be formed and the baby will be 2-3 inches in size. °Body changes during your first trimester °Your body goes through many changes during pregnancy. The changes vary and generally return to normal after your baby is born. °Physical changes °You may gain or lose weight. °Your breasts may begin to grow larger and become tender. The tissue that surrounds your nipples (areola) may become darker. °Dark spots or blotches (chloasma or mask of pregnancy) may develop on your face. °You may have changes in your hair. These can include thickening or thinning of your hair or changes in texture. °Health changes °You may feel nauseous, and you may vomit. °You may have heartburn. °You may develop headaches. °You may develop constipation. °Your gums may bleed and may be sensitive to brushing and flossing. °Other changes °You may tire easily. °You may urinate more often. °Your menstrual periods will stop. °You may have a loss of appetite. °You may develop cravings for certain kinds of food. °You may have changes in your emotions from day to day. °You may have more vivid and strange dreams. °Follow these instructions at home: °Medicines °Follow your health care provider's instructions regarding medicine use. Specific medicines may be either safe or unsafe to take during pregnancy. Do not take any medicines unless told to by your health care provider. °Take a prenatal vitamin that contains at least 600 micrograms (mcg) of folic acid. °Eating and drinking °Eat a healthy diet that includes fresh fruits and vegetables, whole grains, good sources of protein such as meat, eggs, or tofu,  and low-fat dairy products. °Avoid raw meat and unpasteurized juice, milk, and cheese. These carry germs that can harm you and your baby. °If you feel nauseous or you vomit: °Eat 4 or 5 small meals a day instead of 3 large meals. °Try eating a few soda crackers. °Drink liquids between meals instead of during meals. °You may need to take these actions to prevent or treat constipation: °Drink enough fluid to keep your urine pale yellow. °Eat foods that are high in fiber, such as beans, whole grains, and fresh fruits and vegetables. °Limit foods that are high in fat and processed sugars, such as fried or sweet foods. °Activity °Exercise only as directed by your health care provider. Most people can continue their usual exercise routine during pregnancy. Try to exercise for 30 minutes at least 5 days a week. °Stop exercising if you develop pain or cramping in the lower abdomen or lower back. °Avoid exercising if it is very hot or humid or if you are at high altitude. °Avoid heavy lifting. °If you choose to, you may have sex unless your health care provider tells you not to. °Relieving pain and discomfort °Wear a good support bra to relieve breast tenderness. °Rest with your legs elevated if you have leg cramps or low back pain. °If you develop bulging veins (varicose veins) in your legs: °Wear support hose as told by your health care provider. °Elevate your feet for 15 minutes, 3-4 times a day. °Limit salt in your diet. °Safety °Wear your seat belt at all times when driving   or riding in a car. °Talk with your health care provider if someone is verbally or physically abusive to you. °Talk with your health care provider if you are feeling sad or have thoughts of hurting yourself. °Lifestyle °Do not use hot tubs, steam rooms, or saunas. °Do not douche. Do not use tampons or scented sanitary pads. °Do not use herbal remedies, alcohol, illegal drugs, or medicines that are not approved by your health care provider. Chemicals  in these products can harm your baby. °Do not use any products that contain nicotine or tobacco, such as cigarettes, e-cigarettes, and chewing tobacco. If you need help quitting, ask your health care provider. °Avoid cat litter boxes and soil used by cats. These carry germs that can cause birth defects in the baby and possibly loss of the unborn baby (fetus) by miscarriage or stillbirth. °General instructions °During routine prenatal visits in the first trimester, your health care provider will do a physical exam, perform necessary tests, and ask you how things are going. Keep all follow-up visits. This is important. °Ask for help if you have counseling or nutritional needs during pregnancy. Your health care provider can offer advice or refer you to specialists for help with various needs. °Schedule a dentist appointment. At home, brush your teeth with a soft toothbrush. Floss gently. °Write down your questions. Take them to your prenatal visits. °Where to find more information °American Pregnancy Association: americanpregnancy.org °American College of Obstetricians and Gynecologists: acog.org/en/Womens%20Health/Pregnancy °Office on Women's Health: womenshealth.gov/pregnancy °Contact a health care provider if you have: °Dizziness. °A fever. °Mild pelvic cramps, pelvic pressure, or nagging pain in the abdominal area. °Nausea, vomiting, or diarrhea that lasts for 24 hours or longer. °A bad-smelling vaginal discharge. °Pain when you urinate. °Known exposure to a contagious illness, such as chickenpox, measles, Zika virus, HIV, or hepatitis. °Get help right away if you have: °Spotting or bleeding from your vagina. °Severe abdominal cramping or pain. °Shortness of breath or chest pain. °Any kind of trauma, such as from a fall or a car crash. °New or increased pain, swelling, or redness in an arm or leg. °Summary °The first trimester of pregnancy starts on the first day of your last menstrual period until the end of week  12 (months 1 through 3). °Eating 4 or 5 small meals a day rather than 3 large meals may help to relieve nausea and vomiting. °Do not use any products that contain nicotine or tobacco, such as cigarettes, e-cigarettes, and chewing tobacco. If you need help quitting, ask your health care provider. °Keep all follow-up visits. This is important. °This information is not intended to replace advice given to you by your health care provider. Make sure you discuss any questions you have with your health care provider. °Document Revised: 06/30/2019 Document Reviewed: 05/06/2019 °Elsevier Patient Education © 2022 [employer] ° °

## 2021-03-13 NOTE — Progress Notes (Signed)
° °  Subjective:    Patient ID: [name] [name], female    DOB: 2001/02/02, 21 y.o.   MRN: [id number]  No chief complaint on file.   HPI PT presents to the office today with missed menstrual cycle and home + pregnancy test. States her last menstrual cycle was January 1 and ended January 5.   Denies any nausea, vomiting, or breast tenderness. She is not taking any prenatal vitamins.    Review of Systems  All other systems reviewed and are negative.     Objective:   Physical Exam Vitals reviewed.  Constitutional:      General: She is not in acute distress.    Appearance: She is well-developed.  HENT:     Head: Normocephalic and atraumatic.     Right Ear: Tympanic membrane normal.     Left Ear: Tympanic membrane normal.  Eyes:     Pupils: Pupils are equal, round, and reactive to light.  Neck:     Thyroid: No thyromegaly.  Cardiovascular:     Rate and Rhythm: Normal rate and regular rhythm.     Heart sounds: Normal heart sounds. No murmur heard. Pulmonary:     Effort: Pulmonary effort is normal. No respiratory distress.     Breath sounds: Normal breath sounds. No wheezing.  Abdominal:     General: Bowel sounds are normal. There is no distension.     Palpations: Abdomen is soft.     Tenderness: There is no abdominal tenderness.  Musculoskeletal:        General: No tenderness. Normal range of motion.     Cervical back: Normal range of motion and neck supple.  Skin:    General: Skin is warm and dry.  Neurological:     Mental Status: She is alert and oriented to person, place, and time.     Cranial Nerves: No cranial nerve deficit.     Deep Tendon Reflexes: Reflexes are normal and symmetric.  Psychiatric:        Behavior: Behavior normal.        Thought Content: Thought content normal.        Judgment: Judgment normal.     BP 121/71    Pulse (!) 103    Temp 97.6 F (36.4 C) (Temporal)    Ht 5\' 6"  (1.676 m)    Wt 198 lb 9.6 oz (90.1 kg)    LMP 02/04/2021    BMI 32.05 kg/m        Assessment & Plan:  [name] [name] comes in today with chief complaint of upt +    Diagnosis and orders addressed:  1. Missed period - POCT urine pregnancy - Pregnancy, urine - Ambulatory referral to Gynecology  2. Less than [redacted] weeks gestation of pregnancy Force fluids Avoid OTC medications Start prenatal vitamin  - Ambulatory referral to Gynecology   Health Maintenance reviewed Diet and exercise encouraged  Follow up plan: As needed    Evelina Dun, FNP

## 2021-04-04 ENCOUNTER — Other Ambulatory Visit: Payer: Self-pay | Admitting: Obstetrics & Gynecology

## 2021-04-04 DIAGNOSIS — O3680X0 Pregnancy with inconclusive fetal viability, not applicable or unspecified: Secondary | ICD-10-CM

## 2021-04-05 ENCOUNTER — Other Ambulatory Visit: Payer: Self-pay

## 2021-04-05 ENCOUNTER — Ambulatory Visit (INDEPENDENT_AMBULATORY_CARE_PROVIDER_SITE_OTHER): Payer: Medicaid Other

## 2021-04-05 DIAGNOSIS — Z3A08 8 weeks gestation of pregnancy: Secondary | ICD-10-CM | POA: Diagnosis not present

## 2021-04-05 DIAGNOSIS — O3680X0 Pregnancy with inconclusive fetal viability, not applicable or unspecified: Secondary | ICD-10-CM | POA: Diagnosis not present

## 2021-04-05 NOTE — Progress Notes (Signed)
[place] 6+4 wks,single IUP with YS,no fetal heart tones visualized,CRL 7.49 mm,normal ovaries,labs ordered with f/u ultrasound per Cyril Mourning ?

## 2021-04-06 ENCOUNTER — Other Ambulatory Visit: Payer: Medicaid Other

## 2021-04-06 LAB — PROGESTERONE: Progesterone: 12.9 ng/mL

## 2021-04-06 LAB — BETA HCG QUANT (REF LAB): hCG Quant: 106116 m[IU]/mL

## 2021-04-10 ENCOUNTER — Other Ambulatory Visit: Payer: Self-pay

## 2021-04-10 ENCOUNTER — Encounter: Payer: Self-pay | Admitting: Women's Health

## 2021-04-17 ENCOUNTER — Other Ambulatory Visit: Payer: Self-pay | Admitting: Obstetrics & Gynecology

## 2021-04-17 DIAGNOSIS — O3680X0 Pregnancy with inconclusive fetal viability, not applicable or unspecified: Secondary | ICD-10-CM

## 2021-04-18 ENCOUNTER — Other Ambulatory Visit: Payer: Self-pay

## 2021-04-18 ENCOUNTER — Other Ambulatory Visit: Payer: Self-pay | Admitting: Obstetrics & Gynecology

## 2021-04-18 ENCOUNTER — Encounter: Payer: Self-pay | Admitting: Obstetrics & Gynecology

## 2021-04-18 ENCOUNTER — Ambulatory Visit (INDEPENDENT_AMBULATORY_CARE_PROVIDER_SITE_OTHER): Payer: Medicaid Other | Admitting: Obstetrics & Gynecology

## 2021-04-18 ENCOUNTER — Ambulatory Visit (INDEPENDENT_AMBULATORY_CARE_PROVIDER_SITE_OTHER): Payer: Medicaid Other

## 2021-04-18 VITALS — BP 109/75 | HR 74 | Ht 66.0 in | Wt 199.4 lb

## 2021-04-18 DIAGNOSIS — O3680X0 Pregnancy with inconclusive fetal viability, not applicable or unspecified: Secondary | ICD-10-CM

## 2021-04-18 DIAGNOSIS — O039 Complete or unspecified spontaneous abortion without complication: Secondary | ICD-10-CM | POA: Diagnosis not present

## 2021-04-18 MED ORDER — MISOPROSTOL 200 MCG PO TABS: ORAL_TABLET | ORAL | 0 refills | Status: DC

## 2021-04-18 NOTE — Progress Notes (Signed)
US TV: 6+4 wks,single IUP,no FHT,small yolk sac,CRL 6.33 mm,normal ovaries,Dr. Charlotta Newton discussed results with patient  ?

## 2021-04-18 NOTE — Progress Notes (Signed)
? ?  GYN VISIT ?Patient name: SIREN PORRATA MRN [id number]  Date of birth: Dec 19, 2000 ?Chief Complaint:   ?Follow-up ([place] today) ? ?History of Present Illness:   ?SHALETA RUACHO is a 21 y.o. G1P0 female being seen today for early pregnancy ? ?Today's [place] confirmed miscarriage.  She denies pelvic or abdominal pain. Denies vaginal bleeding.   No acute complaints ? ?She reports she has had two prior pregnancies.  Both pregnancies were very early with positive home test, but never had [place] to confirm IUP.  Typically had bleeding before that time. ? ?Patient's last menstrual period was 02/04/2021. ? ?Depression screen [facility] 2/9 03/13/2021 01/25/2021 08/12/2017 05/02/2017 02/27/2017  ?Decreased Interest 0 0 0 0 0  ?Down, Depressed, Hopeless 0 0 0 0 0  ?PHQ - 2 Score 0 0 0 0 0  ?Altered sleeping 0 0 - - -  ?Tired, decreased energy 0 0 - - -  ?Change in appetite 0 0 - - -  ?Feeling bad or failure about yourself  0 0 - - -  ?Trouble concentrating 0 0 - - -  ?Moving slowly or fidgety/restless 0 0 - - -  ?Suicidal thoughts 0 0 - - -  ?PHQ-9 Score 0 0 - - -  ?Difficult doing work/chores Not difficult at all Not difficult at all - - -  ? ? ? ?Review of Systems:   ?Pertinent items are noted in HPI ?Denies fever/chills, dizziness, headaches, visual disturbances, fatigue, shortness of breath, chest pain, abdominal pain, vomiting, bowel movements, urination, or intercourse unless otherwise stated above.  ?Pertinent History Reviewed:  ?Reviewed past medical,surgical, social, obstetrical and family history.  ?Reviewed problem list, medications and allergies. ?Physical Assessment:  ? ?Vitals:  ? 04/18/21 1528  ?BP: 109/75  ?Pulse: 74  ?Weight: 199 lb 6.4 oz (90.4 kg)  ?Height: 5\' 6"  (1.676 m)  ?Body mass index is 32.18 kg/m?. ? ?     Physical Examination:  ? General appearance: alert, well appearing, and in no distress ? Psych: mood appropriate, normal affect ? Skin: warm & dry  ? Cardiovascular: normal heart rate noted ? Respiratory: normal  respiratory effort, no distress ? Abdomen: soft, non-tender  ? Pelvic: examination not indicated ? Extremities: no edema  ? ?Chaperone: N/A   ? ?Assessment & Plan:  ?1) Miscarriage ?-reviewed findings ?-discussed management options- pt would prefer medical management ?-reviewed precautions ?-Rx for cytotec sent in, f/u in 1wk ? ? ?Meds ordered this encounter  ?Medications  ? misoprostol (CYTOTEC) 200 MCG tablet  ?  Sig: Please 3 tablets in the vagina, if needed repeat in 2 days  ?  Dispense:  6 tablet  ?  Refill:  0  ? ? ? ? ?Return in about 1 week (around 04/25/2021) for SAB follow up (Eure or Jamoni Broadfoot). ? ? ?04/27/2021, DO ?Attending Obstetrician & Gynecologist, Faculty Practice ?[facility], [facility] ? ? ? ?

## 2021-04-25 ENCOUNTER — Encounter: Payer: Self-pay | Admitting: Obstetrics & Gynecology

## 2021-04-25 ENCOUNTER — Ambulatory Visit (INDEPENDENT_AMBULATORY_CARE_PROVIDER_SITE_OTHER): Payer: Medicaid Other | Admitting: Obstetrics & Gynecology

## 2021-04-25 ENCOUNTER — Other Ambulatory Visit: Payer: Self-pay

## 2021-04-25 VITALS — BP 125/75 | HR 81 | Ht 66.0 in | Wt 198.6 lb

## 2021-04-25 DIAGNOSIS — O039 Complete or unspecified spontaneous abortion without complication: Secondary | ICD-10-CM

## 2021-04-25 DIAGNOSIS — Z30016 Encounter for initial prescription of transdermal patch hormonal contraceptive device: Secondary | ICD-10-CM | POA: Diagnosis not present

## 2021-04-25 MED ORDER — NORELGESTROMIN-ETH ESTRADIOL 150-35 MCG/24HR TD PTWK: 1.0000 | MEDICATED_PATCH | TRANSDERMAL | 12 refills | Status: DC

## 2021-04-25 NOTE — Progress Notes (Signed)
? ?GYN VISIT ?Patient name: [name] [name] MRN [id number]  Date of birth: Dec 05, 2000 ?Chief Complaint:   ?Follow-up (SAB) ? ?History of Present Illness:   ?[name] [name] is a 21 y.o. G92P0010 female being seen today for follow up regarding: ? ?-Miscarriage- diagnosed on 04/18/21.  She was given cytotec- she does report that this caused some heavy bleeding, passage of clots and cramping.  Since her bleeding has started to slow.  Denies fever or chills.  No abdominal or pelvic pain currently.   ?hCG- 04/05/21: [phone].    ? ?Patient's last menstrual period was 04/18/2021. ? ? ?  03/13/2021  ?  3:28 PM 01/25/2021  ? 10:44 AM 08/12/2017  ? 10:35 AM 05/02/2017  ?  9:28 AM 02/27/2017  ?  8:27 AM  ?Depression screen PHQ 2/9  ?Decreased Interest 0 0 0 0 0  ?Down, Depressed, Hopeless 0 0 0 0 0  ?PHQ - 2 Score 0 0 0 0 0  ?Altered sleeping 0 0     ?Tired, decreased energy 0 0     ?Change in appetite 0 0     ?Feeling bad or failure about yourself  0 0     ?Trouble concentrating 0 0     ?Moving slowly or fidgety/restless 0 0     ?Suicidal thoughts 0 0     ?PHQ-9 Score 0 0     ?Difficult doing work/chores Not difficult at all Not difficult at all     ? ? ? ?Review of Systems:   ?Pertinent items are noted in HPI ?Denies fever/chills, dizziness, headaches, visual disturbances, fatigue, shortness of breath, chest pain, abdominal pain, vomiting, bowel movements, urination unless otherwise stated above.  ?Pertinent History Reviewed:  ?Reviewed past medical,surgical, social, obstetrical and family history.  ?Reviewed problem list, medications and allergies. ?Physical Assessment:  ? ?Vitals:  ? 04/25/21 1412  ?BP: 125/75  ?Pulse: 81  ?Weight: 198 lb 9.6 oz (90.1 kg)  ?Height: 5\' 6"  (1.676 m)  ?Body mass index is 32.05 kg/m?. ? ?     Physical Examination:  ? General appearance: alert, well appearing, and in no distress ? Psych: mood appropriate, normal affect ? Skin: warm & dry  ? Cardiovascular: normal heart rate noted ? Respiratory: normal  respiratory effort, no distress ? Abdomen: soft, non-tender  ? Pelvic: VULVA: normal appearing vulva with no masses, tenderness or lesions, VAGINA: normal appearing vagina with normal color and discharge, no lesions, CERVIX: normal appearing cervix without discharge or lesions- appears closed, scant amount of blood at os, UTERUS: uterus is normal size, shape, consistency and nontender ? Extremities: no edema ? ?Chaperone:  pt declined    ? ?Assessment & Plan:  ?1) Miscarriage ?-based on history suspect complete SAB; however, reviewed concerns regarding possible retained products ?-further management pending hCG results, plan to follow serial hCG ? ?2) Contraceptive options ?-reviewed options ?-OCP risk assessment: Pt denies personal history of VTE, stroke or heart attack.  Denies personal h/o breast cancer.  Pt is either a non-smoker or smoker under the age of 23yo.  Denies h/o migraines with aura ?-Plan for patch though may consider nuvaring  ? ?Meds ordered this encounter  ?Medications  ? norelgestromin-ethinyl estradiol 21yo) 150-35 MCG/24HR transdermal patch  ?  Sig: Place 1 patch onto the skin once a week.  ?  Dispense:  3 patch  ?  Refill:  12  ? ? ? ?Orders Placed This Encounter  ?Procedures  ? Beta hCG quant (ref lab)  ? ? ?  Return for 1 week lab appt. ? ? ?Myna Hidalgo, DO ?Attending Obstetrician & Gynecologist, Faculty Practice ?[facility] for [employer], [facility] ? ? ? ?

## 2022-04-05 IMAGING — US US OB < 14 WEEKS - US OB TV
1 series · 15 of 28 positions shown · non-contrast
Comparison: None

CLINICAL DATA: Vaginal bleeding in first trimester of pregnancy,
LMP 09/23/2019, quantitative beta HCG 255

EXAM:
OBSTETRIC <14 WK US AND TRANSVAGINAL OB US
TECHNIQUE: Both transabdominal and transvaginal ultrasound examinations were
performed for complete evaluation of the gestation as well as the
maternal uterus, adnexal regions, and pelvic cul-de-sac.
Transvaginal technique was performed to assess early pregnancy.

[Series 1: us ob < 14 weeks - us ob tv · 15 of 58 slices shown]
[im 1/58]
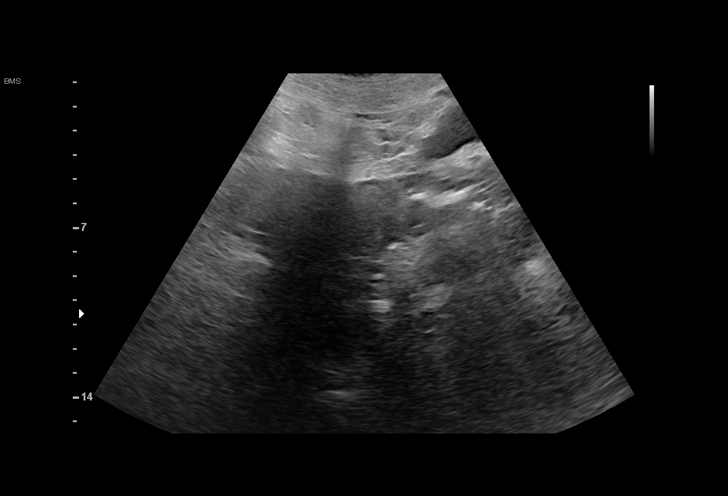
[im 5/58]
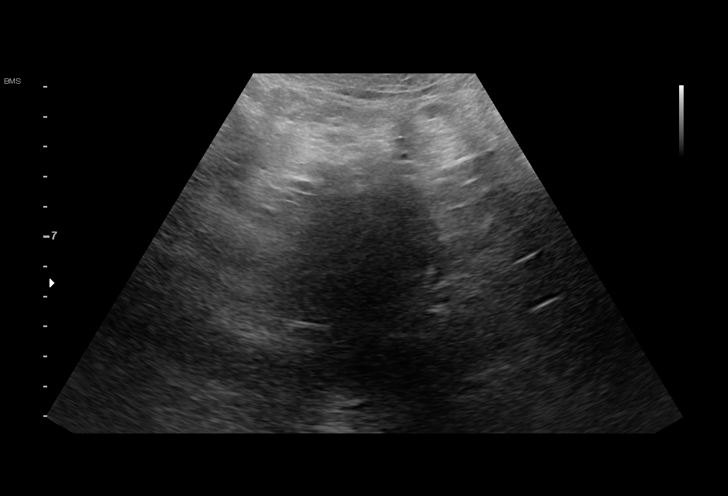
[im 9/58]
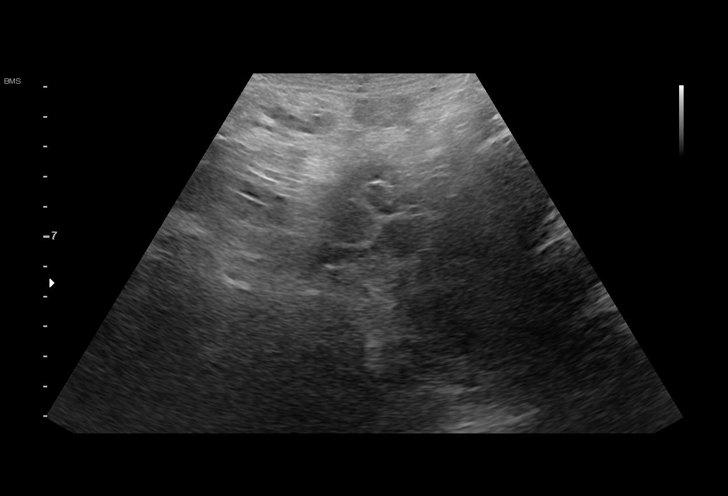
[im 13/58]
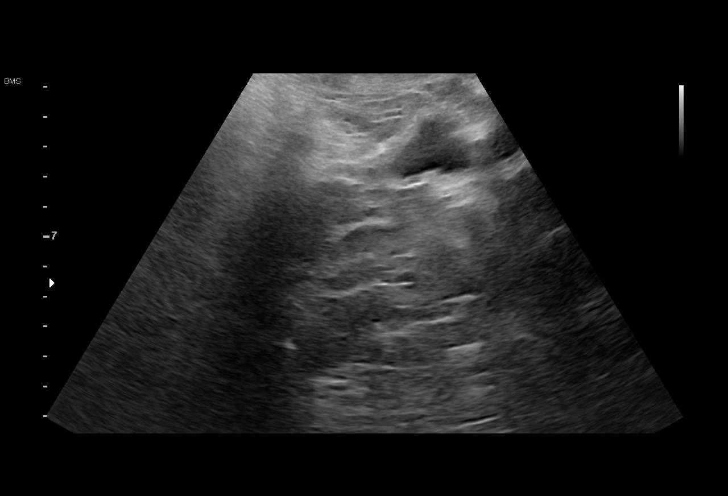
[im 17/58]
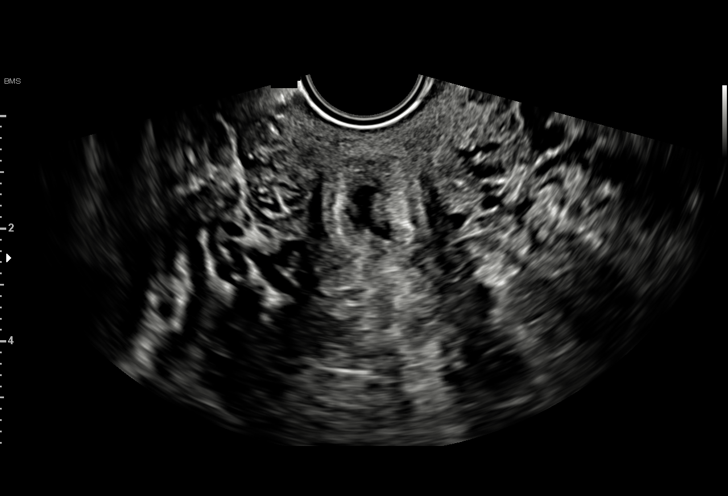
[im 22/58]
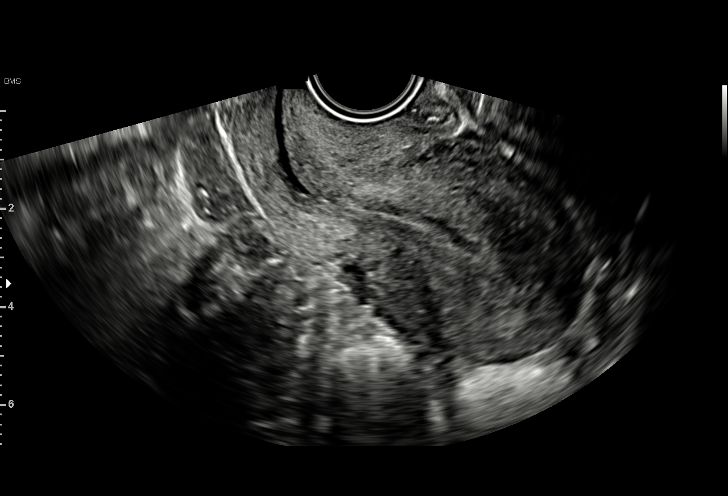
[im 26/58]
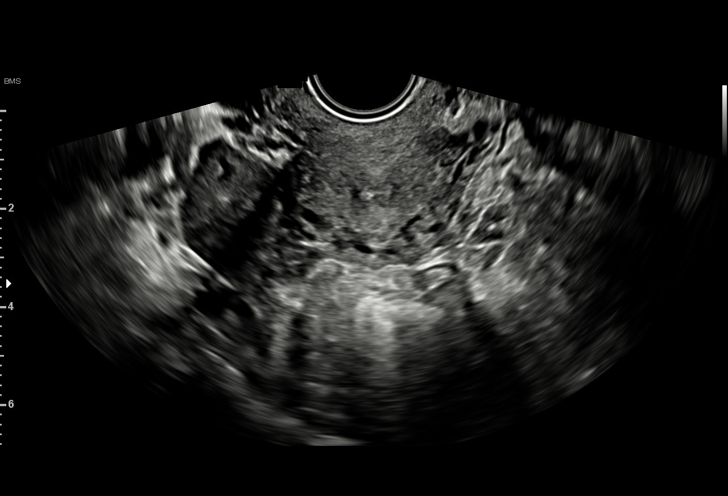
[im 30/58]
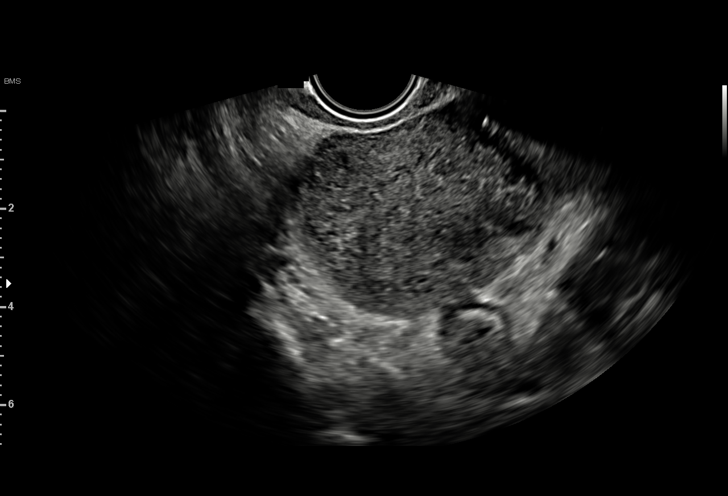
[im 32/58]
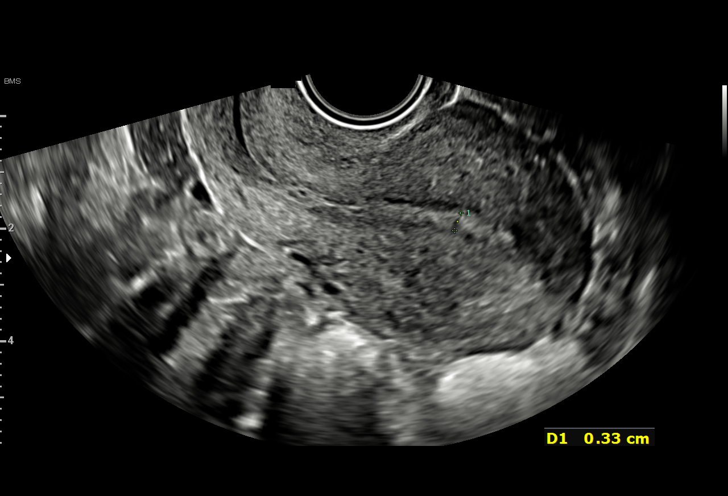
[im 36/58]
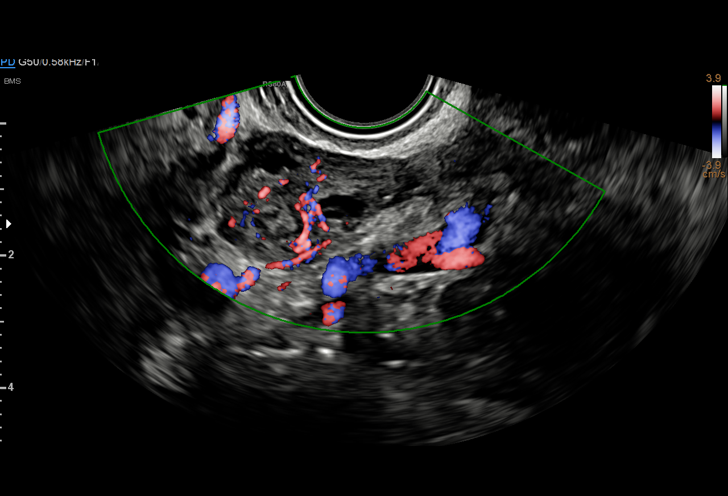
[im 41/58]
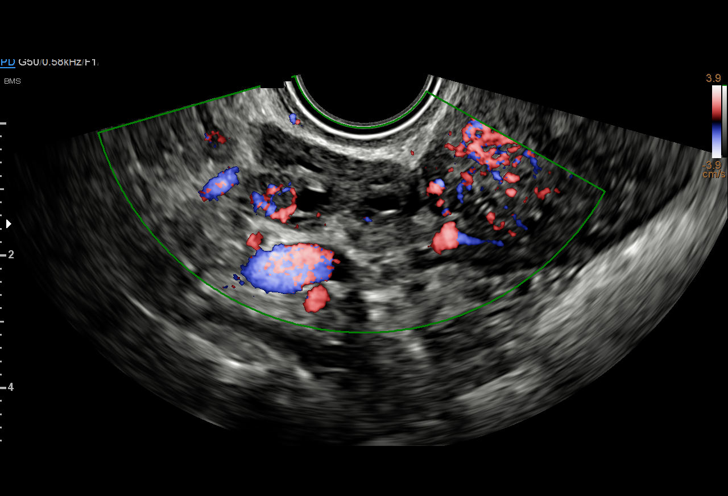
[im 45/58]
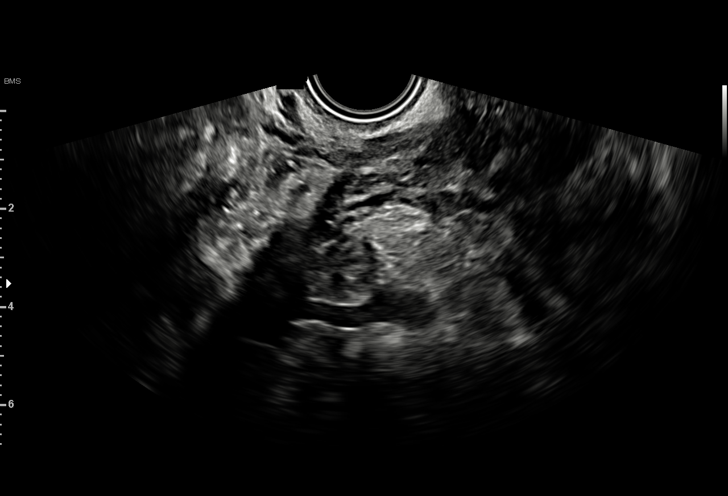
[im 49/58]
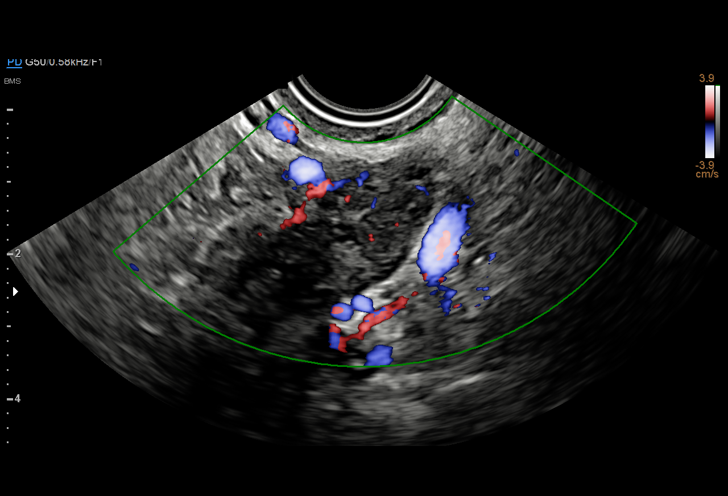
[im 53/58]
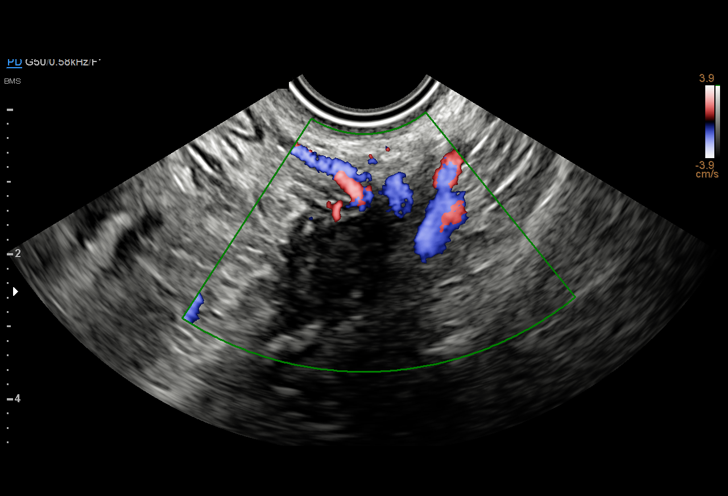
[im 58/58]
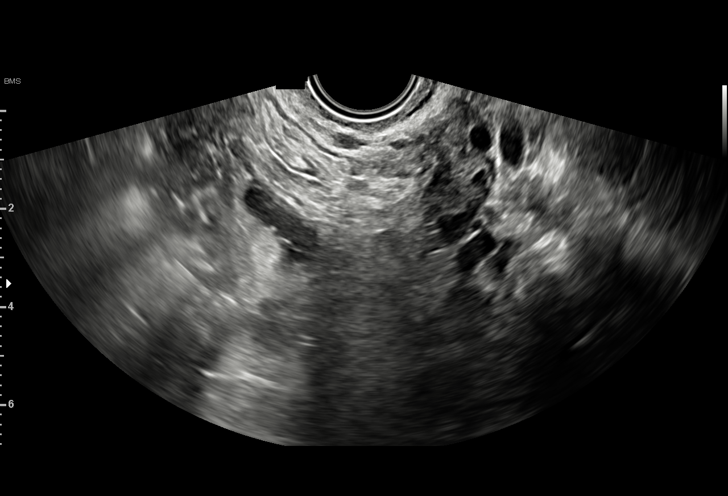

[15 of 28 positions shown; findings below may reference images not displayed]

FINDINGS: Intrauterine gestational sac: Absent

Yolk sac:  N/A

Embryo:  N/A

Cardiac Activity: N/A

Heart Rate: N/A  bpm

MSD:   mm    w     d

CRL:    mm    w    d                  US EDC:

Subchorionic hemorrhage:  Insert LEFT

Maternal uterus/adnexae:

Uterus retroverted, normal in appearance.

Small amount of endocervical canal fluid is seen.

No focal uterine mass, endometrial fluid, or gestational sac
identified.

Endometrial complex measures 5 mm thick.

RIGHT ovary normal size and morphology, 3.5 x 2.1 x 3.5 cm.

LEFT ovary normal size and morphology, 1.4 x 3.4 x 1.6 cm.

No free pelvic fluid or adnexal masses.
IMPRESSION: No intrauterine gestation identified.

Findings are consistent with pregnancy of unknown location.

Differential diagnosis includes early intrauterine pregnancy too
early to visualize, spontaneous abortion, and ectopic pregnancy.

Serial quantitative beta HCG and or follow-up ultrasound recommended
to definitively exclude ectopic pregnancy.

## 2023-03-24 IMAGING — US US OB < 14 WEEKS - US OB TV
1 series · 15 of 28 positions shown · non-contrast
Comparison: OB ultrasound 11/05/2019.

CLINICAL DATA: 20-year-old pregnant female with vaginal bleeding.

EXAM:
OBSTETRIC <14 WK US AND TRANSVAGINAL OB US
TECHNIQUE: Both transabdominal and transvaginal ultrasound examinations were
performed for complete evaluation of the gestation as well as the
maternal uterus, adnexal regions, and pelvic cul-de-sac.
Transvaginal technique was performed to assess early pregnancy.

[Series 1: us ob < 14 weeks - us ob tv · 15 of 46 slices shown]
[im 1/46]
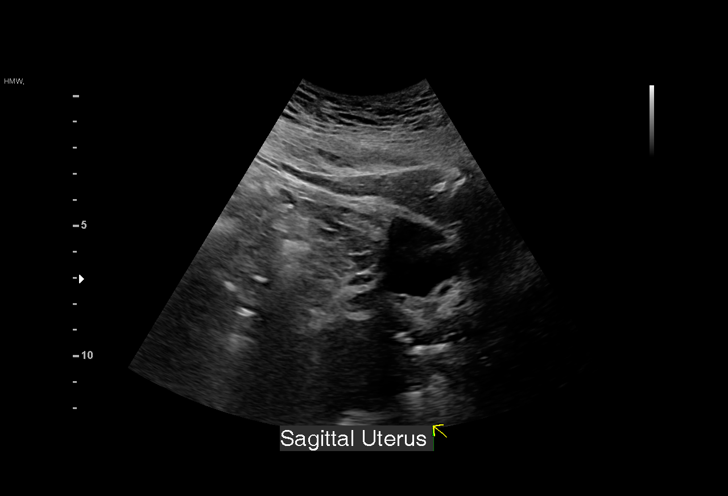
[im 4/46]
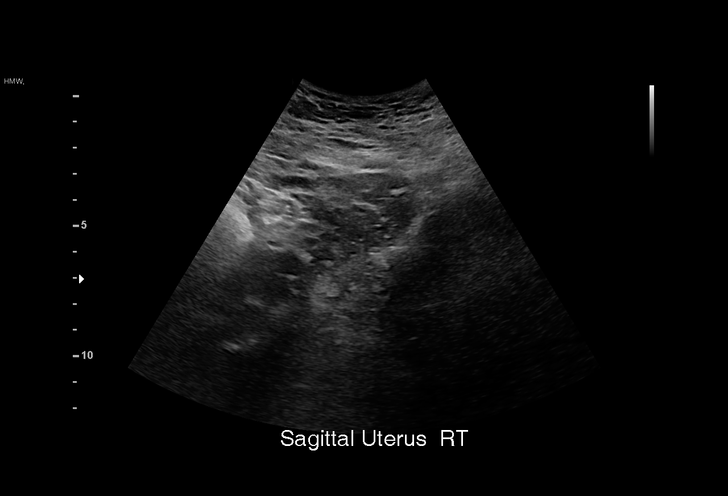
[im 7/46]
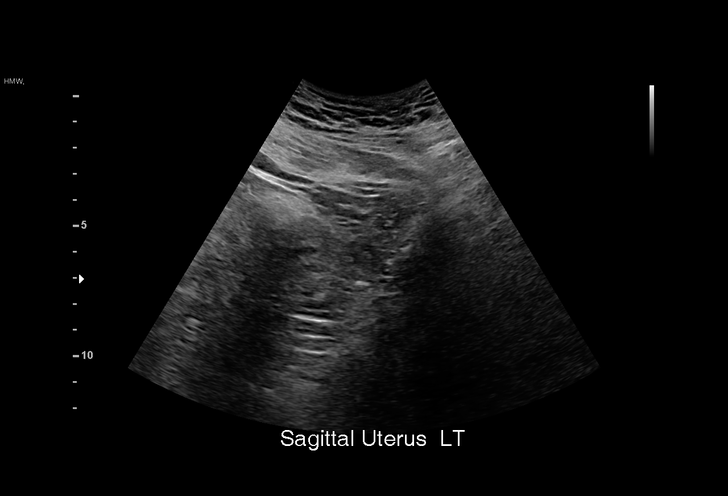
[im 11/46]
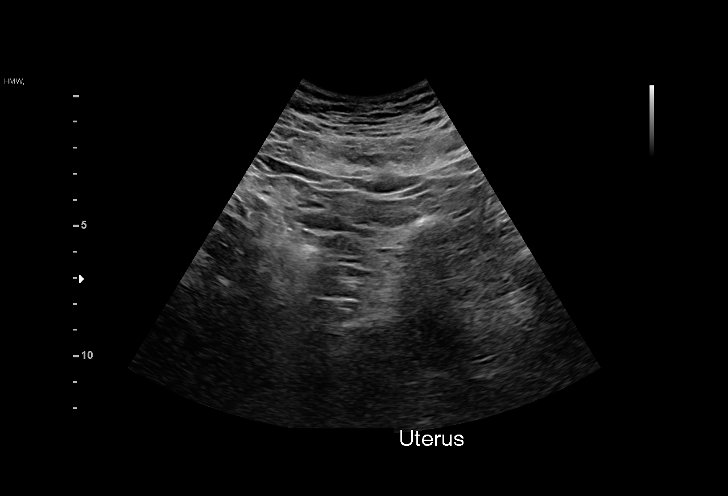
[im 14/46]
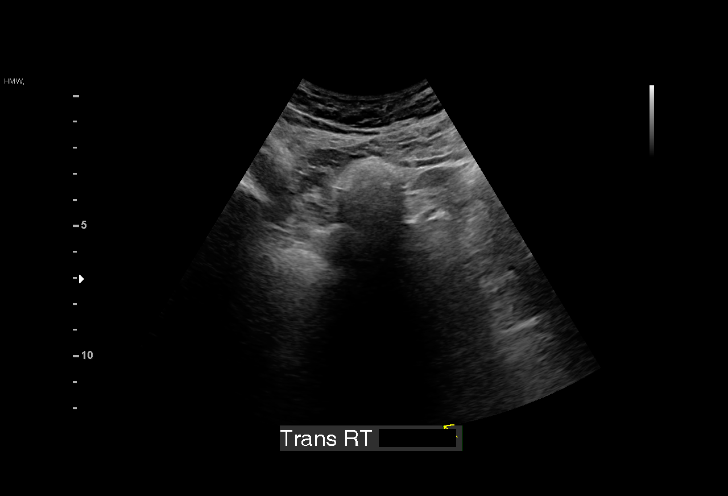
[im 17/46]
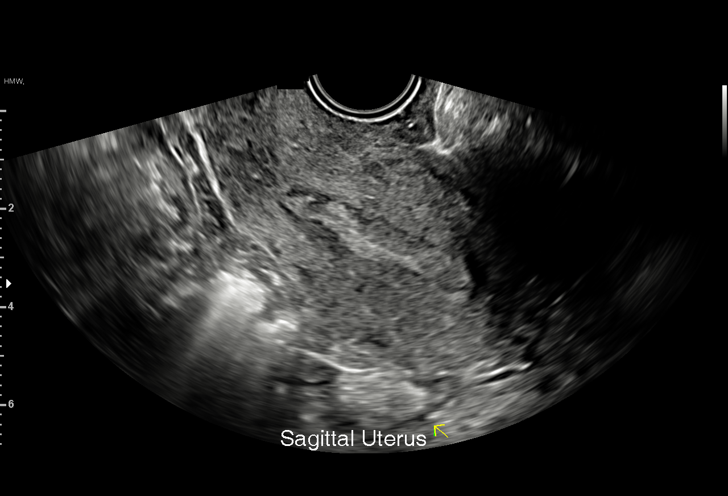
[im 21/46]
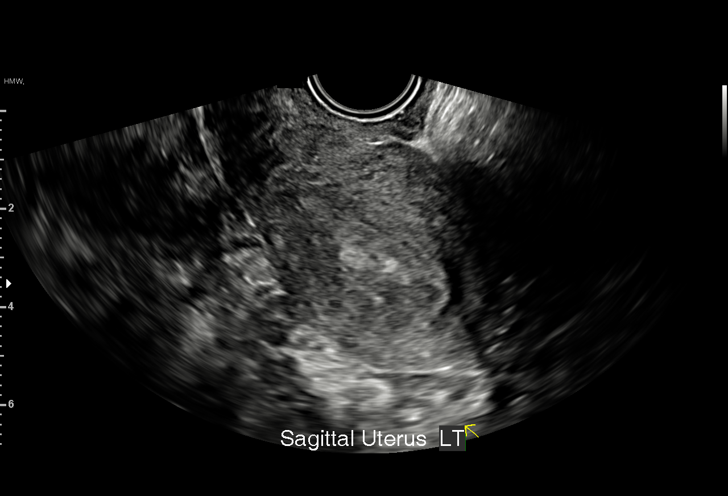
[im 24/46]
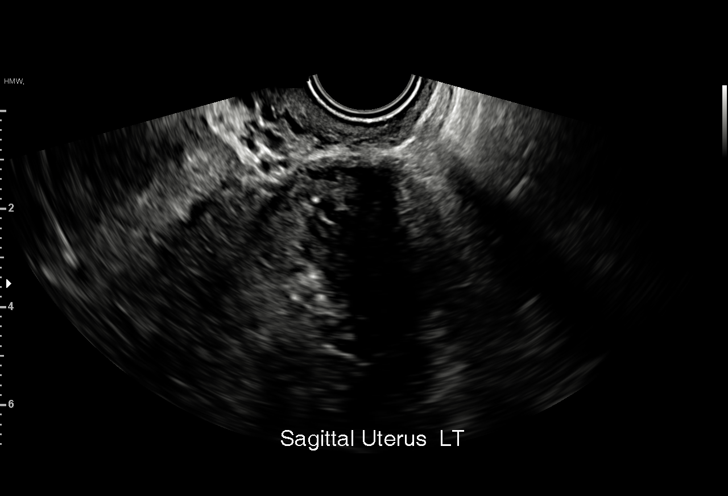
[im 26/46]
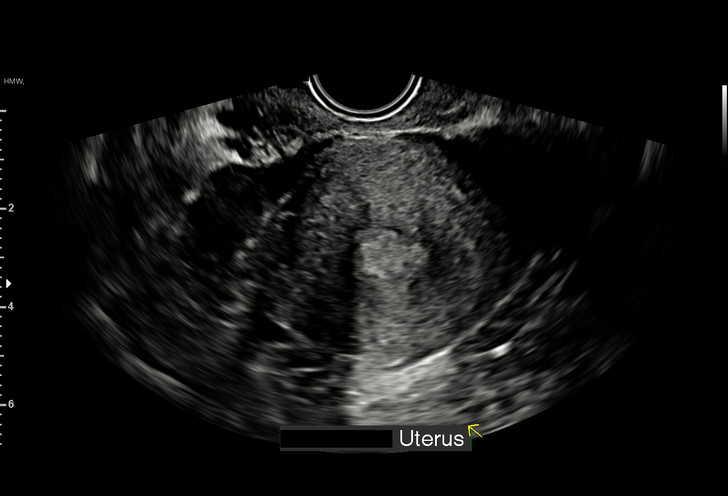
[im 29/46]
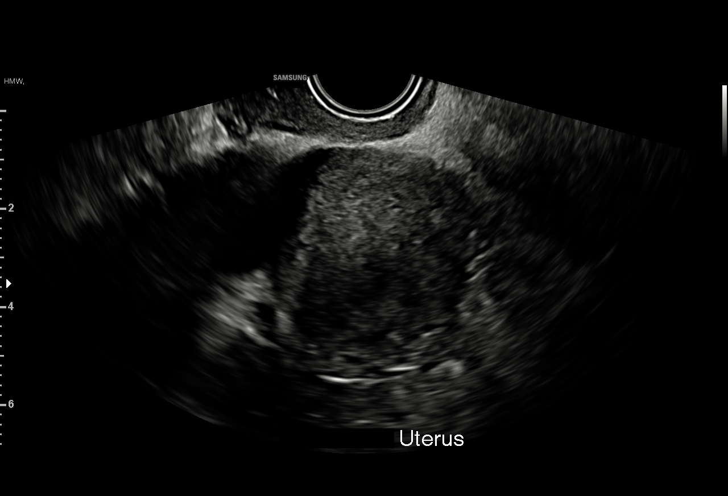
[im 32/46]
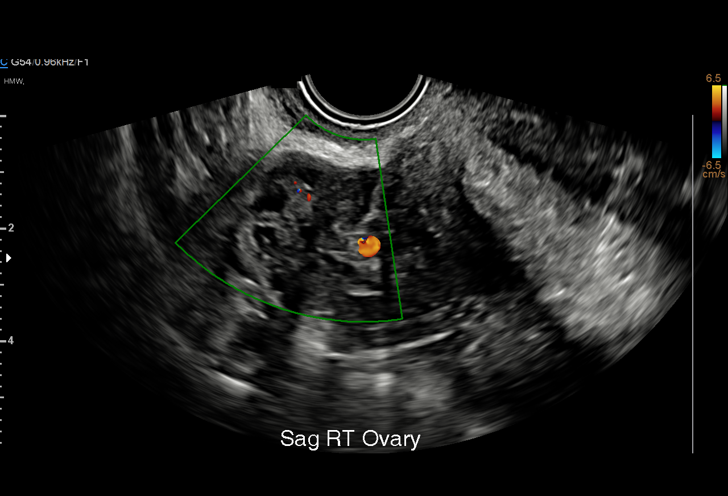
[im 36/46]
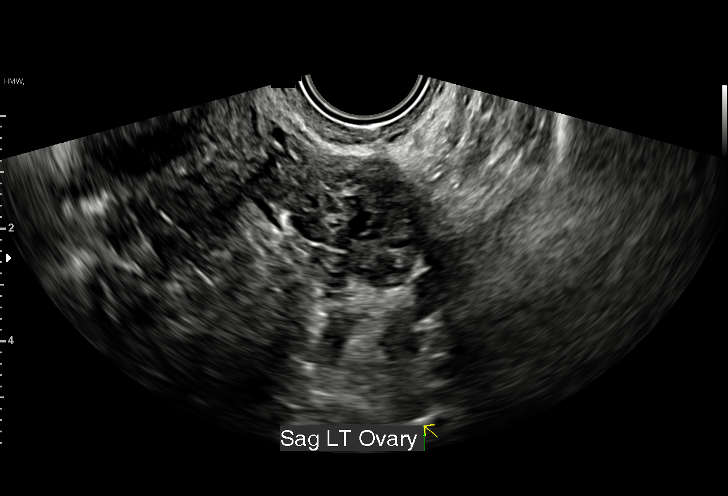
[im 39/46]
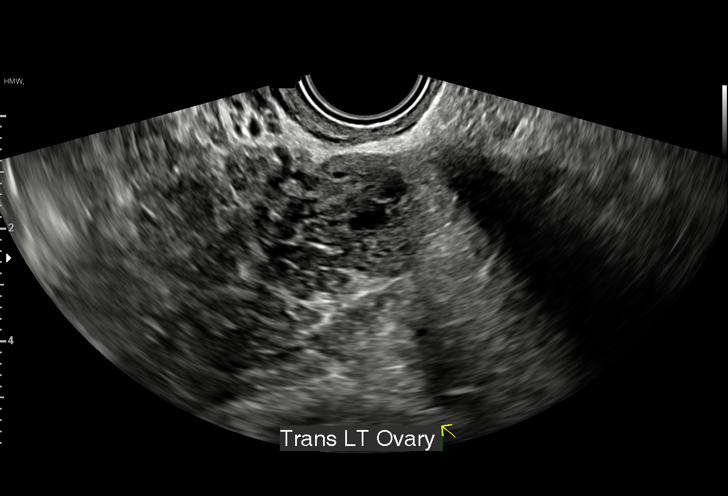
[im 42/46]
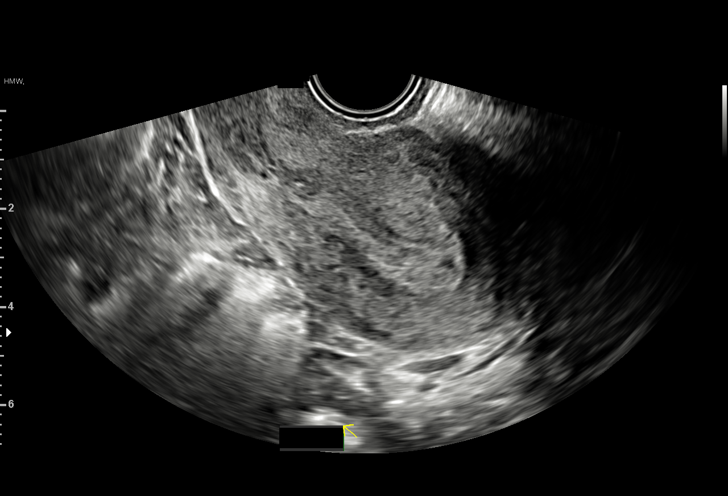
[im 46/46]
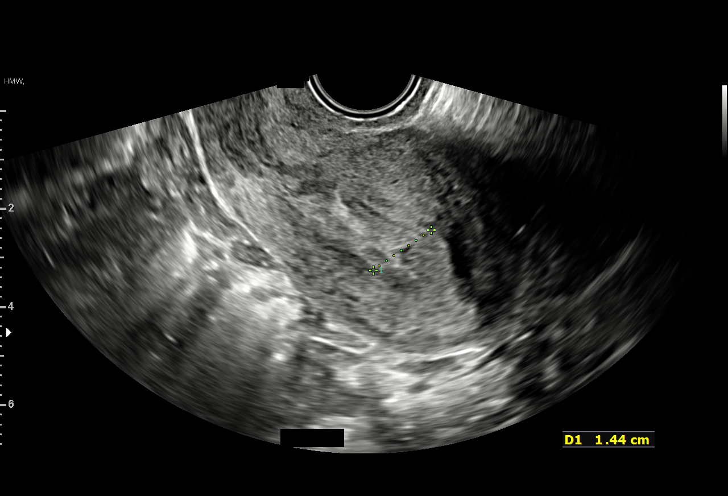

[15 of 28 positions shown; findings below may reference images not displayed]

FINDINGS: Intrauterine gestational sac: None.

Yolk sac:  None.

Embryo:  None.

Cardiac Activity: None.

Heart Rate: N/A

Subchorionic hemorrhage:  None visualized.

Maternal uterus/adnexae: Mobile echogenic material in the
endometrial canal likely represents blood products. Bilateral
ovaries are normal in appearance.
IMPRESSION: 1. No viable IUP identified. Findings on today's examination likely
reflect a spontaneous abortion in progress.

## 2023-12-09 ENCOUNTER — Encounter: Payer: Self-pay | Admitting: Family

## 2023-12-09 ENCOUNTER — Ambulatory Visit: Admitting: Family

## 2023-12-09 VITALS — BP 117/81 | HR 85 | Temp 97.7°F | Ht 66.0 in | Wt 219.0 lb

## 2023-12-09 DIAGNOSIS — Z202 Contact with and (suspected) exposure to infections with a predominantly sexual mode of transmission: Secondary | ICD-10-CM

## 2023-12-09 DIAGNOSIS — B3731 Acute candidiasis of vulva and vagina: Secondary | ICD-10-CM | POA: Diagnosis not present

## 2023-12-09 DIAGNOSIS — Z23 Encounter for immunization: Secondary | ICD-10-CM

## 2023-12-09 LAB — WET PREP FOR TRICH, YEAST, CLUE
Clue Cell Exam: NEGATIVE
Trichomonas Exam: NEGATIVE
Yeast Exam: POSITIVE — AB

## 2023-12-09 MED ORDER — FLUCONAZOLE 150 MG PO TABS
150.0000 mg | ORAL_TABLET | ORAL | 0 refills | Status: AC
Start: 2023-12-09 — End: ?

## 2023-12-09 NOTE — Progress Notes (Signed)
 Subjective:    Patient ID: [name] [name], female    DOB: 2000-09-17, 23 y.o.   MRN: [id number]  Chief Complaint  Patient presents with   Exposure to STD   PT presents to the office today to follow up on positive Trich, BV, and yeast. She was given doxycyline, Flagyl, and diflucan .   She avoided sex until treatment.   She reports having a slight brown discharge.  Exposure to STD  The patient's primary symptoms include a discharge. The patient's pertinent negatives include no dysuria or genital itching. The current episode started in the past 7 days. The problem has been unchanged. The vaginal discharge was brown. Pertinent negatives include no anorexia, diaphoresis, fever, genital odor, sore throat or urinary frequency. She has tried antibiotics for the symptoms. The treatment provided moderate relief.      Review of Systems  Constitutional:  Negative for diaphoresis and fever.  HENT:  Negative for sore throat.   Gastrointestinal:  Negative for anorexia.  Genitourinary:  Negative for dysuria and frequency.  All other systems reviewed and are negative.   Social History   Socioeconomic History   Marital status: Single    Spouse name: Not on file   Number of children: Not on file   Years of education: Not on file   Highest education level: Not on file  Occupational History   Not on file  Tobacco Use   Smoking status: Never   Smokeless tobacco: Never  Vaping Use   Vaping status: Former  Substance and Sexual Activity   Alcohol use: No   Drug use: No   Sexual activity: Not Currently    Birth control/protection: None  Other Topics Concern   Not on file  Social History Narrative   Not on file   Social Drivers of Health   Financial Resource Strain: Not on file  Food Insecurity: Not on file  Transportation Needs: Not on file  Physical Activity: Not on file  Stress: Not on file  Social Connections: Not on file   No family history on file.      Objective:    Physical Exam Vitals reviewed.  Constitutional:      General: She is not in acute distress.    Appearance: She is well-developed.  HENT:     Head: Normocephalic and atraumatic.     Right Ear: Tympanic membrane normal.     Left Ear: Tympanic membrane normal.  Eyes:     Pupils: Pupils are equal, round, and reactive to light.  Neck:     Thyroid: No thyromegaly.  Cardiovascular:     Rate and Rhythm: Normal rate and regular rhythm.     Heart sounds: Normal heart sounds. No murmur heard. Pulmonary:     Effort: Pulmonary effort is normal. No respiratory distress.     Breath sounds: Normal breath sounds. No wheezing.  Abdominal:     General: Bowel sounds are normal. There is no distension.     Palpations: Abdomen is soft.     Tenderness: There is no abdominal tenderness.  Musculoskeletal:        General: No tenderness. Normal range of motion.     Cervical back: Normal range of motion and neck supple.  Skin:    General: Skin is warm and dry.  Neurological:     Mental Status: She is alert and oriented to person, place, and time.     Cranial Nerves: No cranial nerve deficit.     Deep Tendon Reflexes:  Reflexes are normal and symmetric.  Psychiatric:        Behavior: Behavior normal.        Thought Content: Thought content normal.        Judgment: Judgment normal.       BP 117/81   Pulse 85   Temp 97.7 F (36.5 C) (Temporal)   Ht 5' 6 (1.676 m)   Wt 219 lb (99.3 kg)   BMI 35.35 kg/m      Assessment & Plan:  AILAH BARNA comes in today with chief complaint of Exposure to STD   Diagnosis and orders addressed:  1. Encounter for immunization (Primary) - Flu vaccine trivalent PF, 6mos and older(Flulaval,Afluria,Fluarix,Fluzone)  2. Exposure to trichomonas Resolved - WET PREP FOR TRICH, YEAST, CLUE  3. Vagina, candidiasis Keep clean and dry Start diflucan   Avoid scratching Encourage yogurt  - fluconazole  (DIFLUCAN ) 150 MG tablet; Take 1 tablet (150 mg total) by  mouth every 3 (three) days.  Dispense: 3 tablet; Refill: 0       Bari Learn, FNP

## 2023-12-09 NOTE — Patient Instructions (Signed)

## 2023-12-11 ENCOUNTER — Ambulatory Visit: Payer: Self-pay | Admitting: Family
